# Patient Record
Sex: Female | Born: 1950 | Race: White | Hispanic: No | Marital: Married | Smoking: Former smoker
Health system: Southern US, Community
[De-identification: ages and names within clinical notes are randomized; demographics above are authoritative.]

## PROBLEM LIST (undated history)

## (undated) DIAGNOSIS — K620 Anal polyp: Secondary | ICD-10-CM

## (undated) HISTORY — DX: Anal polyp: K62.0

---

## 1998-05-16 ENCOUNTER — Other Ambulatory Visit: Admission: RE | Admit: 1998-05-16 | Discharge: 1998-05-16 | Payer: Self-pay | Admitting: Gynecology

## 1999-06-19 ENCOUNTER — Other Ambulatory Visit: Admission: RE | Admit: 1999-06-19 | Discharge: 1999-06-19 | Payer: Self-pay | Admitting: Gynecology

## 2000-02-11 ENCOUNTER — Encounter: Payer: Self-pay | Admitting: Gynecology

## 2000-02-11 ENCOUNTER — Encounter: Admission: RE | Admit: 2000-02-11 | Discharge: 2000-02-11 | Payer: Self-pay | Admitting: Gynecology

## 2000-06-24 ENCOUNTER — Other Ambulatory Visit: Admission: RE | Admit: 2000-06-24 | Discharge: 2000-06-24 | Payer: Self-pay | Admitting: Gynecology

## 2001-02-11 ENCOUNTER — Encounter: Admission: RE | Admit: 2001-02-11 | Discharge: 2001-02-11 | Payer: Self-pay | Admitting: Gynecology

## 2001-02-11 ENCOUNTER — Encounter: Payer: Self-pay | Admitting: Gynecology

## 2001-07-02 ENCOUNTER — Other Ambulatory Visit: Admission: RE | Admit: 2001-07-02 | Discharge: 2001-07-02 | Payer: Self-pay | Admitting: Gynecology

## 2002-02-15 ENCOUNTER — Encounter: Admission: RE | Admit: 2002-02-15 | Discharge: 2002-02-15 | Payer: Self-pay | Admitting: Gynecology

## 2002-02-15 ENCOUNTER — Encounter: Payer: Self-pay | Admitting: Gynecology

## 2002-03-02 ENCOUNTER — Encounter: Payer: Self-pay | Admitting: Family Medicine

## 2002-03-02 ENCOUNTER — Ambulatory Visit (HOSPITAL_COMMUNITY): Admission: RE | Admit: 2002-03-02 | Discharge: 2002-03-02 | Payer: Self-pay | Admitting: Family Medicine

## 2002-07-05 ENCOUNTER — Other Ambulatory Visit: Admission: RE | Admit: 2002-07-05 | Discharge: 2002-07-05 | Payer: Self-pay | Admitting: Gynecology

## 2003-03-23 ENCOUNTER — Encounter: Admission: RE | Admit: 2003-03-23 | Discharge: 2003-03-23 | Payer: Self-pay | Admitting: Gynecology

## 2003-03-23 ENCOUNTER — Encounter: Payer: Self-pay | Admitting: Gynecology

## 2003-08-08 ENCOUNTER — Other Ambulatory Visit: Admission: RE | Admit: 2003-08-08 | Discharge: 2003-08-08 | Payer: Self-pay | Admitting: Gynecology

## 2004-06-19 ENCOUNTER — Encounter: Admission: RE | Admit: 2004-06-19 | Discharge: 2004-06-19 | Payer: Self-pay | Admitting: Gynecology

## 2004-10-01 ENCOUNTER — Other Ambulatory Visit: Admission: RE | Admit: 2004-10-01 | Discharge: 2004-10-01 | Payer: Self-pay | Admitting: Gynecology

## 2005-07-02 ENCOUNTER — Encounter: Admission: RE | Admit: 2005-07-02 | Discharge: 2005-07-02 | Payer: Self-pay | Admitting: Gynecology

## 2005-09-19 ENCOUNTER — Encounter: Admission: RE | Admit: 2005-09-19 | Discharge: 2005-09-19 | Payer: Self-pay | Admitting: Family Medicine

## 2005-10-17 ENCOUNTER — Other Ambulatory Visit: Admission: RE | Admit: 2005-10-17 | Discharge: 2005-10-17 | Payer: Self-pay | Admitting: Gynecology

## 2005-11-19 ENCOUNTER — Encounter: Admission: RE | Admit: 2005-11-19 | Discharge: 2005-11-19 | Payer: Self-pay | Admitting: Family Medicine

## 2005-11-19 IMAGING — CR DG SHOULDER 2+V*L*
3 series · 3 of 3 positions shown · non-contrast
Comparison: None.

CLINICAL DATA: Shoulder pain following injury six weeks ago. 
 LEFT SHOULDER ? VIEWS THREE:

[view not recorded (1 of 3)]
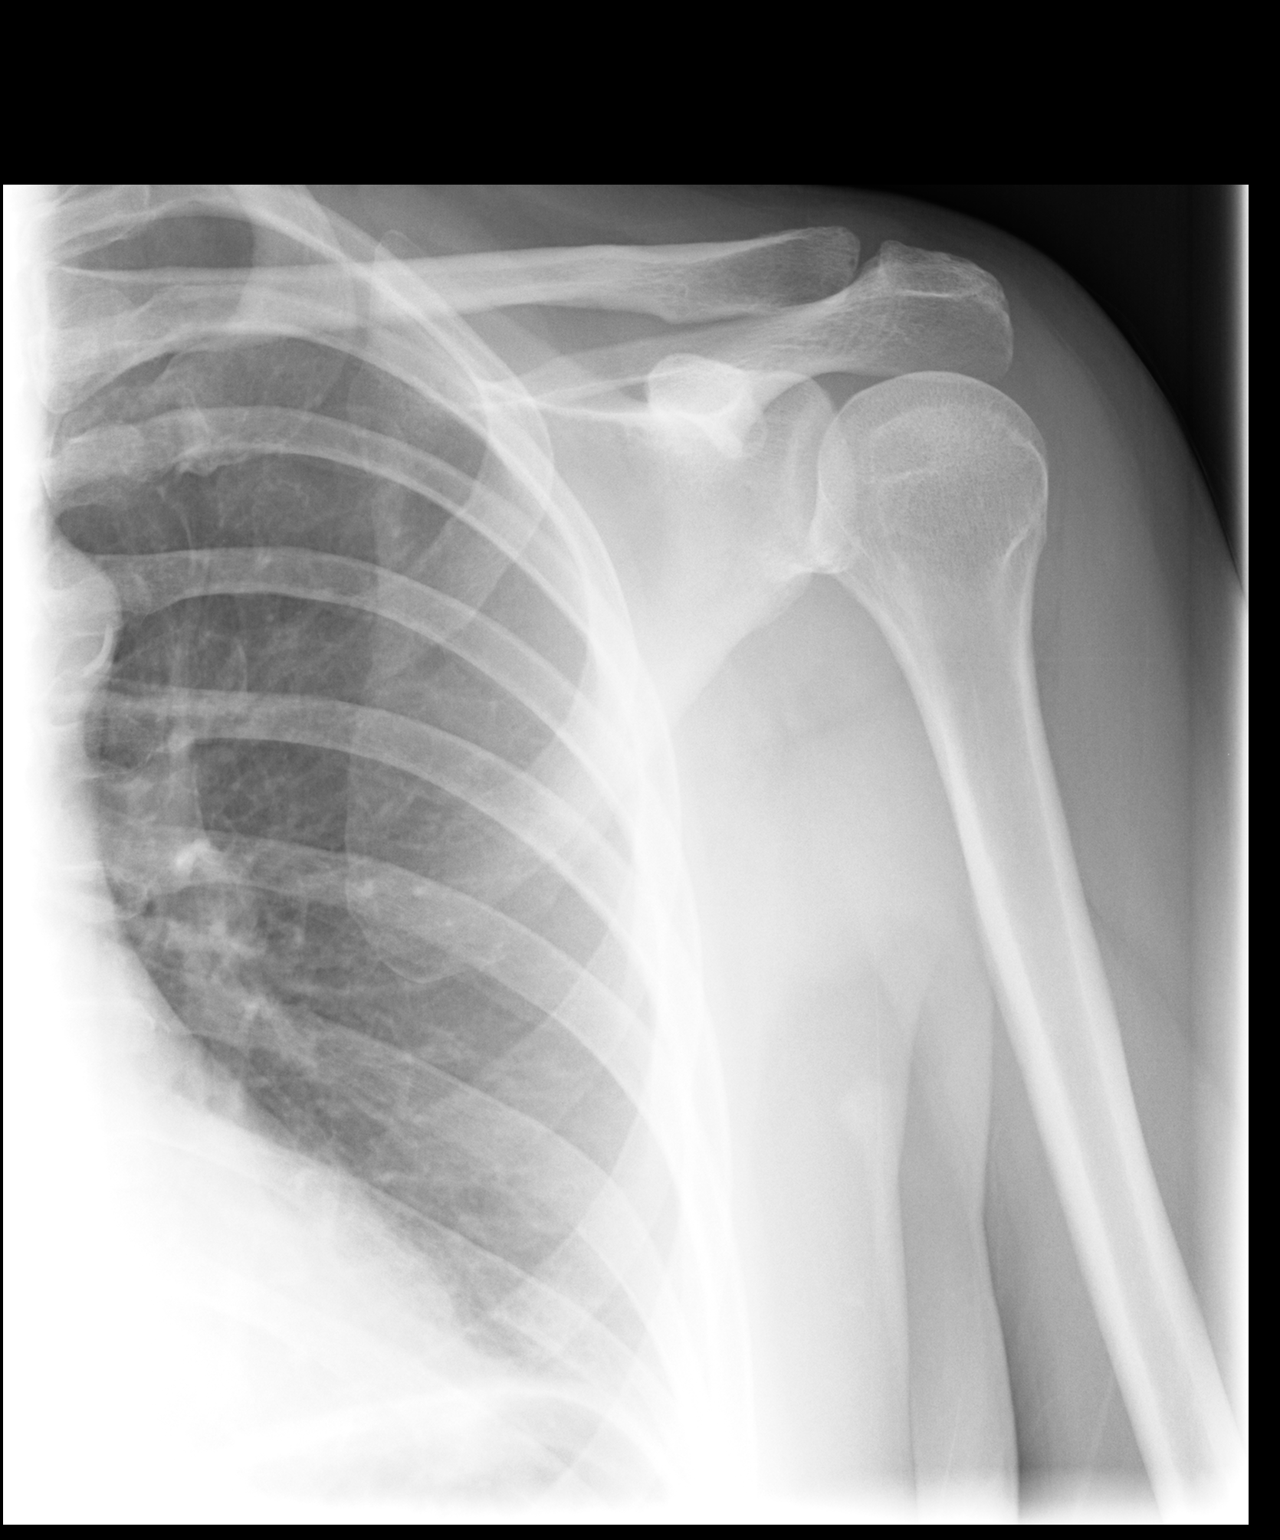

[view not recorded (2 of 3)]
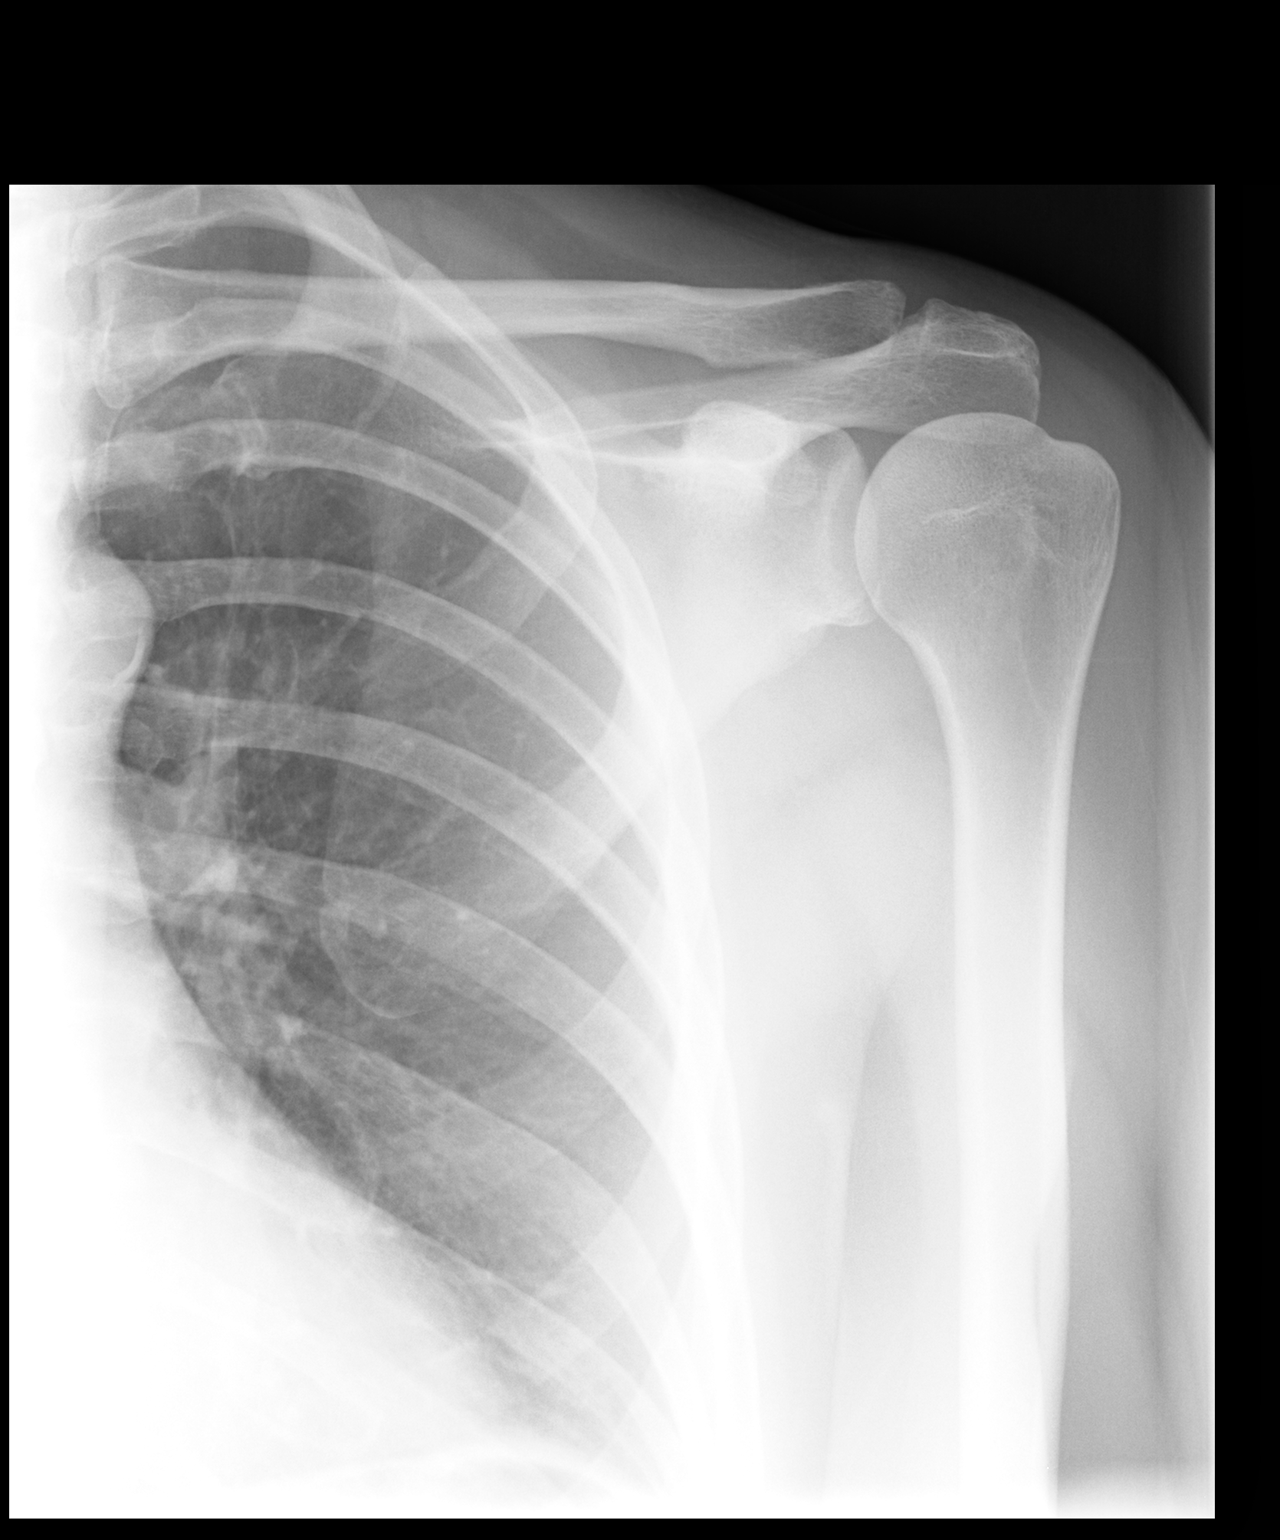

[view not recorded (3 of 3)]
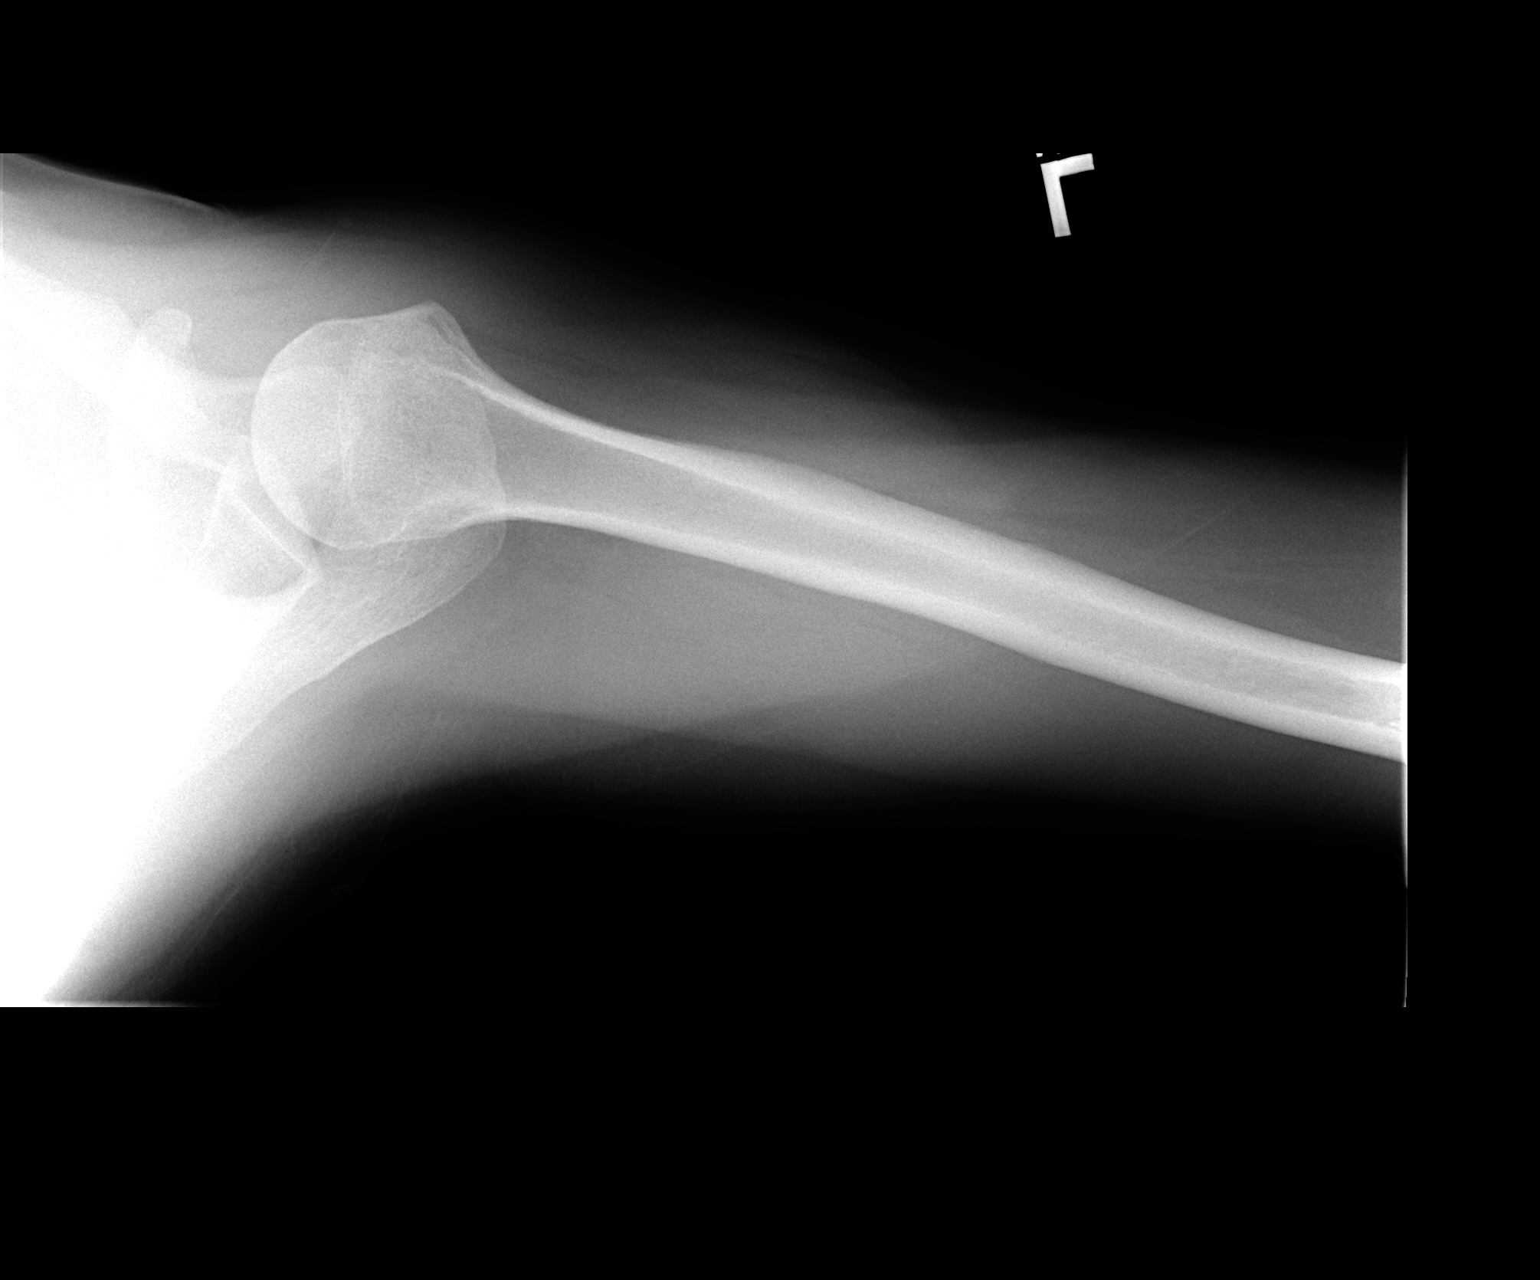

[3 of 3 positions shown; findings below may reference images not displayed]

FINDINGS: There is no evidence of fracture or dislocation.  There is no evidence of arthropathy or other focal bone abnormality.  Soft tissues are unremarkable.
IMPRESSION: Negative.

## 2006-12-05 ENCOUNTER — Ambulatory Visit (HOSPITAL_COMMUNITY): Admission: RE | Admit: 2006-12-05 | Discharge: 2006-12-05 | Payer: Self-pay | Admitting: Gynecology

## 2006-12-22 ENCOUNTER — Other Ambulatory Visit: Admission: RE | Admit: 2006-12-22 | Discharge: 2006-12-22 | Payer: Self-pay | Admitting: Gynecology

## 2007-04-23 ENCOUNTER — Encounter: Admission: RE | Admit: 2007-04-23 | Discharge: 2007-04-23 | Payer: Self-pay | Admitting: Surgery

## 2007-06-04 ENCOUNTER — Ambulatory Visit (HOSPITAL_COMMUNITY): Admission: RE | Admit: 2007-06-04 | Discharge: 2007-06-04 | Payer: Self-pay | Admitting: Surgery

## 2007-06-04 ENCOUNTER — Encounter (INDEPENDENT_AMBULATORY_CARE_PROVIDER_SITE_OTHER): Payer: Self-pay | Admitting: Surgery

## 2008-01-29 ENCOUNTER — Ambulatory Visit (HOSPITAL_COMMUNITY): Admission: RE | Admit: 2008-01-29 | Discharge: 2008-01-29 | Payer: Self-pay | Admitting: Gynecology

## 2009-03-08 ENCOUNTER — Ambulatory Visit (HOSPITAL_COMMUNITY): Admission: RE | Admit: 2009-03-08 | Discharge: 2009-03-08 | Payer: Self-pay | Admitting: Gynecology

## 2009-10-30 ENCOUNTER — Encounter: Admission: RE | Admit: 2009-10-30 | Discharge: 2009-10-30 | Payer: Self-pay | Admitting: Surgery

## 2009-11-23 ENCOUNTER — Ambulatory Visit (HOSPITAL_COMMUNITY): Admission: RE | Admit: 2009-11-23 | Discharge: 2009-11-23 | Payer: Self-pay | Admitting: Surgery

## 2010-06-05 ENCOUNTER — Ambulatory Visit (HOSPITAL_COMMUNITY): Admission: RE | Admit: 2010-06-05 | Discharge: 2010-06-05 | Payer: Self-pay | Admitting: Gynecology

## 2011-03-12 LAB — CBC
Hemoglobin: 14.4 g/dL (ref 12.0–15.0)
MCV: 95.7 fL (ref 78.0–100.0)
Platelets: 242 10*3/uL (ref 150–400)
RBC: 4.47 MIL/uL (ref 3.87–5.11)
RDW: 12.6 % (ref 11.5–15.5)
WBC: 4.8 10*3/uL (ref 4.0–10.5)

## 2011-03-12 LAB — COMPREHENSIVE METABOLIC PANEL
AST: 30 U/L (ref 0–37)
Albumin: 4 g/dL (ref 3.5–5.2)
CO2: 31 mEq/L (ref 19–32)
Chloride: 104 mEq/L (ref 96–112)
Potassium: 5 mEq/L (ref 3.5–5.1)

## 2011-03-12 LAB — DIFFERENTIAL
Basophils Absolute: 0 10*3/uL (ref 0.0–0.1)
Eosinophils Absolute: 0.2 10*3/uL (ref 0.0–0.7)
Lymphs Abs: 1.3 10*3/uL (ref 0.7–4.0)
Monocytes Relative: 9 % (ref 3–12)
Neutro Abs: 2.9 10*3/uL (ref 1.7–7.7)

## 2011-04-23 NOTE — Op Note (Signed)
[name], [name]           ACCOUNT NO.:  [id number]   MEDICAL RECORD NO.:  [id number]          PATIENT TYPE:  AMB   LOCATION:  DAY                          FACILITY:  [facility]   PHYSICIAN:  Sandria Bales. Ezzard Standing, M.D.  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   POSTOPERATIVE DIAGNOSIS:  Approximately 1.3 centimeter rectal carcinoid  (uT1N0),  approximately 10 centimeters from anal verge, on right lateral  rectal wall.   PROCEDURE:  1. Flexible sigmoidoscopy.  2. Rectal ultrasound.  3. Transanal excision of rectal carcinoid tumor.   SURGEON:  Dr. Ezzard Standing.   FIRST ASSISTANT:  None.   ANESTHESIA:  General.   ESTIMATED BLOOD LOSS:  50 mL.   DRAINS LEFT IN:  None.   INDICATION FOR PROCEDURE:  [name] is a 60 year old white female  who is a patient of Dr. Lupe Carney who underwent a routine  colonoscopy by Dr. Ritta Slot on the 24th of March, 2008.  He found an  approximately 2 cm submucosal lesion 9 cm from the anal verge and biopsy  of this tumor showed a carcinoid tumor.  She now comes for transanal  excision of this tumor.  I did a CT scan on her and this was negative  for any evidence of any tumor growth through the wall or external  abnormal masses from the rectum.   The indication and potential complications of the procedure were  explained to the patient.  Potential complications include bleeding,  infection, incomplete excision of the tumor, possible local recurrence  of the tumor or even the inability to resect the tumor, that she may  need a more formal colonic resection but we will not do that at this  time.   OPERATIVE NOTE:  Patient placed in lithotomy position.  A timeout was  held identifying the patient, the procedure.   First I did a flexible sigmoidoscopy.  I identified  this tumor at about  10 cm from the anal verge.  This seemed to emanate from the right  lateral rectal wall, was probably about 1.5 cm on examination through an  endoscope.   I then used the 10 MHz rectal ultrasound and did an ultrasound of this  mass.  The ultrasound shows this being a right lateral rectal wall mass.  Themass  has a thickness of 5.4 mm, a width of 15.7 mm, it appears the  mass goes through the submucosal, but not into the muscularis mucosa.  This would be a uT1N0 tumor.   I set up the [facility] retractor to expose the rectum.  I was able  to identify this tumor along the right lateral rectal wall.  It measures  about 10 cm from the anal verge.  It was quite a reach to get to this,  but I was able to put holding sutures in 4 corners around the tumor.  I  then excised the tumor and I thought I got entirety around  it with a rim  of normal mucosa.  I marked a long suture posterior to the tumor and a  short suture anterior as markings or orientations.   I then closed the mucosal defect with a running #2-0 chromic suture.  I  put at least two more chromics in the bleeding sites.  After the  excision had been completed, I irrigated out the rectum, I saw no  bleeding, I was able to pass an anoscope beyond, I thought I had not  compromised the lumen of the bowel.   I then placed a Gelfoam gauze covered in Nupracainal ointment up the  rectum.   Sponge and needle count were correct at the end of the case.  Patient  tolerated the procedure well, was transported to the recovery room with  plans to be discharged home today.      Sandria Bales. Ezzard Standing, M.D.  Electronically Signed     DHN/MEDQ  D:  06/04/2007  T:  06/04/2007  Job:  [id number]   cc:   L. Lupe Carney, M.D.  Fax: [phone]   Gretta Cool, M.D.  Fax: [phone]   Griffith Citron, M.D.  Fax: [phone]

## 2011-06-24 ENCOUNTER — Other Ambulatory Visit (HOSPITAL_COMMUNITY): Payer: Self-pay | Admitting: Gynecology

## 2011-06-24 DIAGNOSIS — Z1231 Encounter for screening mammogram for malignant neoplasm of breast: Secondary | ICD-10-CM

## 2011-07-01 ENCOUNTER — Other Ambulatory Visit: Payer: Self-pay | Admitting: Gynecology

## 2011-07-02 ENCOUNTER — Ambulatory Visit (HOSPITAL_COMMUNITY)
Admission: RE | Admit: 2011-07-02 | Discharge: 2011-07-02 | Disposition: A | Discharge status: Home / Self Care | Payer: BC Managed Care – PPO | Source: Ambulatory Visit | Attending: Gynecology | Admitting: Gynecology

## 2011-07-02 DIAGNOSIS — Z1231 Encounter for screening mammogram for malignant neoplasm of breast: Secondary | ICD-10-CM | POA: Insufficient documentation

## 2011-09-25 LAB — COMPREHENSIVE METABOLIC PANEL
CO2: 30
Calcium: 9.8
Chloride: 106
Creatinine, Ser: 0.87
GFR calc Af Amer: >60
GFR calc non Af Amer: >60
Potassium: 4.4
Total Bilirubin: 1.4 — ABNORMAL HIGH
Total Protein: 6.5

## 2011-09-25 LAB — CBC
HCT: 44.3
MCV: 89.8
RDW: 12.6
WBC: 4.9

## 2011-09-25 LAB — DIFFERENTIAL
Basophils Absolute: 0
Lymphocytes Relative: 26
Monocytes Absolute: 0.6
Monocytes Relative: 13 — ABNORMAL HIGH
Neutro Abs: 2.8
Neutrophils Relative %: 57

## 2012-06-19 ENCOUNTER — Other Ambulatory Visit (HOSPITAL_COMMUNITY): Payer: Self-pay | Admitting: Gynecology

## 2012-06-19 DIAGNOSIS — Z1231 Encounter for screening mammogram for malignant neoplasm of breast: Secondary | ICD-10-CM

## 2012-07-07 ENCOUNTER — Ambulatory Visit (HOSPITAL_COMMUNITY)
Admission: RE | Admit: 2012-07-07 | Discharge: 2012-07-07 | Disposition: A | Discharge status: Home / Self Care | Payer: BC Managed Care – PPO | Source: Ambulatory Visit | Attending: Gynecology | Admitting: Gynecology

## 2012-07-07 DIAGNOSIS — Z1231 Encounter for screening mammogram for malignant neoplasm of breast: Secondary | ICD-10-CM | POA: Insufficient documentation

## 2012-07-10 ENCOUNTER — Other Ambulatory Visit: Payer: Self-pay | Admitting: Gynecology

## 2013-07-02 ENCOUNTER — Other Ambulatory Visit (HOSPITAL_COMMUNITY): Payer: Self-pay | Admitting: Gynecology

## 2013-07-02 DIAGNOSIS — Z1231 Encounter for screening mammogram for malignant neoplasm of breast: Secondary | ICD-10-CM

## 2013-07-09 ENCOUNTER — Ambulatory Visit (HOSPITAL_COMMUNITY)
Admission: RE | Admit: 2013-07-09 | Discharge: 2013-07-09 | Disposition: A | Discharge status: Home / Self Care | Payer: BC Managed Care – PPO | Source: Ambulatory Visit | Attending: Gynecology | Admitting: Gynecology

## 2013-07-09 DIAGNOSIS — Z1231 Encounter for screening mammogram for malignant neoplasm of breast: Secondary | ICD-10-CM

## 2014-06-21 ENCOUNTER — Other Ambulatory Visit (HOSPITAL_COMMUNITY): Payer: Self-pay | Admitting: Gynecology

## 2014-06-21 DIAGNOSIS — Z1231 Encounter for screening mammogram for malignant neoplasm of breast: Secondary | ICD-10-CM

## 2014-07-12 ENCOUNTER — Ambulatory Visit (HOSPITAL_COMMUNITY)
Admission: RE | Admit: 2014-07-12 | Discharge: 2014-07-12 | Disposition: A | Discharge status: Home / Self Care | Payer: Medicare Other | Source: Ambulatory Visit | Attending: Gynecology | Admitting: Gynecology

## 2014-07-12 DIAGNOSIS — Z1231 Encounter for screening mammogram for malignant neoplasm of breast: Secondary | ICD-10-CM | POA: Diagnosis present

## 2015-06-30 ENCOUNTER — Other Ambulatory Visit (HOSPITAL_COMMUNITY): Payer: Self-pay | Admitting: Gynecology

## 2015-06-30 DIAGNOSIS — Z1231 Encounter for screening mammogram for malignant neoplasm of breast: Secondary | ICD-10-CM

## 2015-07-14 ENCOUNTER — Ambulatory Visit (HOSPITAL_COMMUNITY): Payer: BC Managed Care – PPO

## 2015-07-19 ENCOUNTER — Ambulatory Visit (HOSPITAL_COMMUNITY)
Admission: RE | Admit: 2015-07-19 | Discharge: 2015-07-19 | Disposition: A | Discharge status: Home / Self Care | Payer: Medicare Other | Source: Ambulatory Visit | Attending: Gynecology | Admitting: Gynecology

## 2015-07-19 DIAGNOSIS — Z1231 Encounter for screening mammogram for malignant neoplasm of breast: Secondary | ICD-10-CM | POA: Insufficient documentation

## 2017-05-27 ENCOUNTER — Ambulatory Visit (INDEPENDENT_AMBULATORY_CARE_PROVIDER_SITE_OTHER): Payer: Medicare Other

## 2017-05-27 ENCOUNTER — Other Ambulatory Visit (INDEPENDENT_AMBULATORY_CARE_PROVIDER_SITE_OTHER): Payer: Self-pay

## 2017-05-27 ENCOUNTER — Encounter (INDEPENDENT_AMBULATORY_CARE_PROVIDER_SITE_OTHER): Payer: Self-pay

## 2017-05-27 ENCOUNTER — Encounter (INDEPENDENT_AMBULATORY_CARE_PROVIDER_SITE_OTHER): Payer: Self-pay | Admitting: Orthopaedic Surgery

## 2017-05-27 ENCOUNTER — Ambulatory Visit (INDEPENDENT_AMBULATORY_CARE_PROVIDER_SITE_OTHER): Payer: Medicare Other | Admitting: Orthopaedic Surgery

## 2017-05-27 VITALS — BP 147/96 | HR 77 | Resp 12 | Ht 66.0 in | Wt 175.0 lb

## 2017-05-27 DIAGNOSIS — M791 Myalgia, unspecified site: Secondary | ICD-10-CM

## 2017-05-27 DIAGNOSIS — M5440 Lumbago with sciatica, unspecified side: Secondary | ICD-10-CM

## 2017-05-27 DIAGNOSIS — M25551 Pain in right hip: Secondary | ICD-10-CM

## 2017-05-27 MED ORDER — METHYLPREDNISOLONE ACETATE 40 MG/ML IJ SUSP
40.0000 mg | INTRAMUSCULAR | Status: AC | PRN
  Administered 2017-05-27: 40 mg via INTRAMUSCULAR

## 2017-05-27 MED ORDER — LIDOCAINE HCL 1 % IJ SOLN
2.0000 mL | INTRAMUSCULAR | Status: AC | PRN
  Administered 2017-05-27: 2 mL

## 2017-05-27 MED ORDER — BUPIVACAINE HCL 0.5 % IJ SOLN
2.0000 mL | INTRAMUSCULAR | Status: AC | PRN
  Administered 2017-05-27: 2 mL

## 2017-05-27 NOTE — Progress Notes (Signed)
Office Visit Note   Patient: [name]           Date of Birth: [birth year]           MRN: [id number] Visit Date: 05/27/2017              Requested by: No referring provider defined for this encounter. PCP: Alroy Dust, L.Marlou Sa, MD   Assessment & Plan: Visit Diagnoses:  1. Acute right-sided low back pain with sciatica, sciatica laterality unspecified   2. Pain in right hip     Plan:  #1: Injection of right parasacral area administered by Dr. Durward Fortes #2: Sent to physical therapy  Follow-Up Instructions: Return if symptoms worsen or fail to improve.   Orders:  Orders Placed This Encounter  Procedures  . XR Lumbar Spine 2-3 Views  . XR HIP UNILAT W OR W/O PELVIS 2-3 VIEWS RIGHT   No orders of the defined types were placed in this encounter.     Procedures: Trigger Point Inj Date/Time: 05/27/2017 11:01 AM Performed by: Biagio Borg D Authorized by: Biagio Borg D   Consent Given by:  Patient Site marked: the procedure site was marked   Timeout: prior to procedure the correct patient, procedure, and site was verified   Total # of Trigger Points:  1 Location: back   Needle Size:  25 G Medications #1:  2 mL lidocaine 1 %; 2 mL bupivacaine 0.5 %; 40 mg methylPREDNISolone acetate 40 MG/ML Patient tolerance:  Patient tolerated the procedure well with no immediate complications     Clinical Data: No additional findings.   Subjective: Chief Complaint  Patient presents with  . Lower Back - Pain    [name] is a 21 y o that presents with LBP 2 weeks when she was lifting her grand children. She relates 7 yrs ago LBP but nothing since. She takes aleve for relief. Trouble sleeping, pain radiates sometimes to R sided hip pain.    [name] is a pleasant 66 y/o o that presents with LBP for the past 2 weeks which started when she was lifting her grand children. She relates 7 yrs ago LBP but nothing since until now. She states that she has trouble  sleeping as well as some pain radiatingf sometimes to the right sided hip. She takes aleve for relief.     Review of Systems  Constitutional: Negative.   HENT: Negative.   Respiratory: Negative.   Cardiovascular: Negative.   Gastrointestinal: Negative.   Genitourinary: Negative.   Skin: Negative.   Neurological: Negative.   Hematological: Negative.   Psychiatric/Behavioral: Negative.      Objective: Vital Signs: BP (!) 147/96   Pulse 77   Resp 12   Ht 5\' 6"  (1.676 m)   Wt 175 lb (79.4 kg)   BMI 28.25 kg/m   Physical Exam  Constitutional: She is oriented to person, place, and time. She appears well-developed and well-nourished.  HENT:  Head: Normocephalic and atraumatic.  Eyes: EOM are normal. Pupils are equal, round, and reactive to light.  Pulmonary/Chest: Effort normal.  Neurological: She is alert and oriented to person, place, and time.  Skin: Skin is warm and dry.  Psychiatric: She has a normal mood and affect. Her behavior is normal. Judgment and thought content normal.    Ortho Exam  She has tenderness over the right parasacral area to palpation. This is localized. Deep tendon reflexes are physiologic bilateral symmetric in lower extremities. Negative straight leg raising. Good strength. Neurovascular  intact distally.  Specialty Comments:  No specialty comments available.  Imaging: Xr Hip Unilat W Or W/o Pelvis 2-3 Views Right  Result Date: 05/27/2017 Two-view AP pelvis and right hip reveals maintenance of joint space of the right hip. Little bit of spurring at the lateral aspect of the superior acetabulum. She does have some degenerative changes symphysis pubis. The SI joint certainly on the right is quite degenerative in comparison to the left. Multiple vasculitic ligature clips are noted in the pelvis and distal lumbar spine.  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  Some degenerative changes in the facets noted. Lateral x-rays today reveal some degenerative changes L2-3. Anterior spurring at L2 and L3. Also some at L1.    PMFS History: There are no active problems to display for this patient.  Past Medical History:  Diagnosis Date  . Polyp of anal verge     Family History  Problem Relation Age of Onset  . Sjogren's syndrome Mother     History reviewed. No pertinent surgical history. Social History   Occupational History  . Not on file.   Social History Main Topics  . Smoking status: Former Research scientist (life sciences)  . Smokeless tobacco: Never Used  . Alcohol use Not on file  . Drug use: No  . Sexual activity: Not on file

## 2017-05-29 ENCOUNTER — Encounter: Payer: Self-pay | Admitting: Physical Therapy

## 2017-05-29 ENCOUNTER — Ambulatory Visit: Payer: Medicare Other | Attending: Family Medicine | Admitting: Physical Therapy

## 2017-05-29 DIAGNOSIS — M545 Low back pain, unspecified: Secondary | ICD-10-CM

## 2017-05-29 DIAGNOSIS — M62830 Muscle spasm of back: Secondary | ICD-10-CM | POA: Diagnosis present

## 2017-05-30 NOTE — Therapy (Signed)
[place], [place], [address] Phone: [phone]   Fax:  [phone]  Physical Therapy Treatment  Patient Details  Name: [name] MRN: [id number] Date of Birth: Dec 13, 1950 Referring Provider: Dr Joni Fears   Encounter Date: 05/29/2017      PT End of Session - 05/30/17 1315    Visit Number 1   Number of Visits 8   Date for PT Re-Evaluation 06/27/17   Authorization Type Medicare   PT Start Time 1415   PT Stop Time 1500   PT Time Calculation (min) 45 min   Activity Tolerance Patient tolerated treatment well   Behavior During Therapy [facility] for tasks assessed/performed      Past Medical History:  Diagnosis Date  . Polyp of anal verge     History reviewed. No pertinent surgical history.  There were no vitals filed for this visit.      Subjective Assessment - 05/29/17 1423    Patient Stated Goals To have less pain and a plan for dealing with her back in the future.    Currently in Pain? Yes   Pain Score 6    Pain Location Back   Pain Orientation Right   Pain Descriptors / Indicators Aching   Pain Type Acute pain   Pain Onset 1 to 4 weeks ago   Pain Frequency Intermittent   Aggravating Factors  Bending and twisting    Pain Relieving Factors Rest, naprosin    Effect of Pain on Daily Activities Difficulty lifting; Has not been back to the gym             [facility] PT Assessment - 05/30/17 0001      Assessment   Medical Diagnosis Right sided low back pain    Referring Provider Dr Joni Fears    Onset Date/Surgical Date 05/22/17   Hand Dominance Right   Next MD Visit None Scheduled    Prior Therapy none     Precautions   Precautions None     Restrictions   Weight Bearing Restrictions No     Balance Screen   Has the patient fallen in the past 6 months No   Has the patient had a decrease in activity level because of a fear of falling?  No   Is the patient reluctant to  leave their home because of a fear of falling?  No     Home Environment   Additional Comments Steps at the house. Can feel it in her knees     Prior Function   Level of Independence Independent   Vocation Retired   Leisure gym, walking      Cognition   Overall Cognitive Status Within Functional Limits for tasks assessed   Attention Focused   Focused Attention Appears intact   Memory Appears intact   Awareness Appears intact   Problem Solving Appears intact     Observation/Other Assessments   Observations left leg has chrnic edema from a lymph node surgery    Focus on Therapeutic Outcomes (FOTO)  46% limitation      Sensation   Light Touch Appears Intact   Additional Comments Denies parathesias      Coordination   Gross Motor Movements are Fluid and Coordinated Yes   Fine Motor Movements are Fluid and Coordinated Yes     Posture/Postural Control   Posture Comments rounded shoulders      AROM   AROM Assessment Site Lumbar   Lumbar Flexion  25% limited with some reporduction of pain    Lumbar - Right Side Bend 25% limited with s reproduction of pain    Lumbar - Right Rotation 25% limited with some reporduction of pain      PROM   Overall PROM Comments normal PROM of patients hisp but minor increase inpain with right hip flexion      Strength   Overall Strength Comments Left hip / knee 5/5 despite edema    Strength Assessment Site Hip;Knee   Right/Left Hip Right   Right Hip Flexion 4+/5   Right Hip ABduction 4+/5   Right/Left Knee Right   Right Knee Flexion 5/5   Right Knee Extension 5/5     Flexibility   Soft Tissue Assessment /Muscle Length --  90/90 hamstring 20 degrees right 17 degrees left      Palpation   Palpation comment spasming from L4-L5 paraspinals along PSIS and into glut med area      Special Tests    Special Tests --  SLR (+) 30 degrees on the right      Ambulation/Gait   Gait Comments normal gait                      [facility]  Adult PT Treatment/Exercise - 05/30/17 0001      Lumbar Exercises: Stretches   Passive Hamstring Stretch Limitations 90/90 hamstring stretch x30seconds bilateral    Prone Mid Back Stretch Limitations praye and lateral prayer stretch to the left to open up facet joints    Piriformis Stretch Limitations 3x20 sec      Lumbar Exercises: Standing   Other Standing Lumbar Exercises tennis ball trigger point release      Lumbar Exercises: Supine   AB Set Limitations 2x10                 PT Education - 05/30/17 1312    Education provided Yes   Education Details HEP for strethcing, symptom management    Person(s) Educated Patient   Methods Explanation;Demonstration;Tactile cues;Verbal cues   Comprehension Verbalized understanding;Returned demonstration;Verbal cues required;Tactile cues required          PT Short Term Goals - 05/30/17 1320      PT SHORT TERM GOAL #1   Title LTG = STG           PT Long Term Goals - 05/30/17 1320      PT LONG TERM GOAL #1   Title Patient will have an HEP that promotes core stability    Time 4   Period Weeks   Status New     PT LONG TERM GOAL #2   Title Patient will demsotrate 5/5 bilateral LE strength in order to lift her grandchildren    Time 4   Period Weeks   Status New     PT LONG TERM GOAL #3   Title Patient will demsotrate proper lifting tehcnique    Time 4   Period Weeks   Status New     PT LONG TERM GOAL #4   Title Patient will demsotrate a 26% limitation on FOTO    Time 4   Period Weeks   Status New               Plan - 05/30/17 1317    Clinical Impression Statement Patient is a 66 year old female with right sided lower back pain. The patient has more pain when rotates to the right and when she side  bends to the right. She appears to have facet joint dysfucntion on that side. She was given stretches this visit to help improve her spainal mobility. She will come back for 1 more visit to work on core  strengthening exercises. She was seen for a low complexity evaluation. 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    Clinical Presentation Evolving   Clinical Decision Making Moderate   Rehab Potential Good   PT Frequency 2x / week   PT Duration 8 weeks   PT Treatment/Interventions ADLs/Self Care Home Management;Iontophoresis 4mg /ml Dexamethasone;Electrical Stimulation;Cryotherapy;Moist Heat;Traction;Dry needling;Taping;Splinting;Patient/family education;Therapeutic exercise;Therapeutic activities;Functional mobility training   PT Next Visit Plan review core exercises clam shells, bridges, supine marches, SLR; shoulder extension with abdonminal bracing; review lifting technique    PT Home Exercise Plan see instructions    Consulted and Agree with Plan of Care Patient      Patient will benefit from skilled therapeutic intervention in order to improve the following deficits and impairments:     Visit Diagnosis: Acute right-sided low back pain without sciatica - Plan: PT plan of care cert/re-cert  Muscle spasm of back - Plan: PT plan of care cert/re-cert       G-Codes - Jun 26, 2017 1323    Functional Assessment Tool Used (Outpatient Only) FOTO, Clinical decision making    Functional Limitation Carrying, moving and handling objects   Carrying, Moving and Handling Objects Current Status (K7425) At least 40 percent but less than 60 percent impaired, limited or restricted   Carrying, Moving and Handling Objects Goal Status (Z5638) At least 20 percent but less than 40 percent impaired, limited or restricted      Problem List There are no active problems to display for this patient.   Carney Living 06-26-17, 1:25 PM  [facility] [address] [place], [place], [address] Phone: [phone]   Fax:  [phone]  Name: [name] MRN: [id number] Date of Birth: 1951/03/05

## 2017-06-06 ENCOUNTER — Encounter: Payer: Self-pay | Admitting: Physical Therapy

## 2017-06-06 ENCOUNTER — Ambulatory Visit: Payer: Medicare Other | Admitting: Physical Therapy

## 2017-06-06 DIAGNOSIS — M545 Low back pain, unspecified: Secondary | ICD-10-CM

## 2017-06-06 DIAGNOSIS — M62830 Muscle spasm of back: Secondary | ICD-10-CM

## 2017-06-06 NOTE — Therapy (Signed)
[place], [place], [address] Phone: [phone]   Fax:  [phone]  Physical Therapy Treatment/ Discharge  Patient Details  Name: [name] MRN: [id number] Date of Birth: June 12, 1951 Referring Provider: Dr Joni Fears   Encounter Date: 06/06/2017      PT End of Session - 06/06/17 0803    Visit Number 2   Number of Visits 8   Date for PT Re-Evaluation 06/27/17   Authorization Type Medicare   PT Start Time 8841   PT Stop Time 0843   PT Time Calculation (min) 45 min      Past Medical History:  Diagnosis Date  . Polyp of anal verge     History reviewed. No pertinent surgical history.  There were no vitals filed for this visit.      Subjective Assessment - 06/06/17 0805    Subjective Patient feels like the stretches are working well. She is not having any pain today. She would benefit from education on exercises.    Limitations Standing   Patient Stated Goals To have less pain and a plan for dealing with her back in the future.    Currently in Pain? No/denies                                 PT Education - 06/06/17 0816    Education provided Yes   Education Details Reviewed strengthening and progression    Person(s) Educated Patient   Methods Explanation;Demonstration;Tactile cues;Verbal cues;Handout   Comprehension Verbalized understanding;Returned demonstration;Verbal cues required;Tactile cues required          PT Short Term Goals - 05/30/17 1320      PT SHORT TERM GOAL #1   Title LTG = STG           PT Long Term Goals - 06/06/17 6606      PT LONG TERM GOAL #1   Title Patient will have an HEP that promotes core stability    Baseline reviewed today. Patient feels confident with her exercises    Time 4   Period Weeks   Status On-going     PT LONG TERM GOAL #2   Title Patient will demsotrate 5/5 bilateral LE strength in order to lift  her grandchildren    Time 4   Period Weeks   Status On-going     PT LONG TERM GOAL #3   Title Patient will demsotrate proper lifting tehcnique    Time 4   Period Weeks   Status Achieved     PT LONG TERM GOAL #4   Title Patient will demsotrate a 26% limitation on FOTO    Baseline 33% but only did 2 visits    Time 4   Period Weeks   Status Achieved               Plan - 06/06/17 0817    Clinical Impression Statement Patient toleratedtreatment well. No signifcant increase in pain with exercises patient given complete program. She feels comfortable with her exercises D/C to HEP at this time.    Clinical Presentation Stable   Clinical Decision Making Low   Rehab Potential Good   PT Frequency 2x / week   PT Duration 8 weeks   PT Treatment/Interventions ADLs/Self Care Home Management;Iontophoresis '4mg'$ /ml Dexamethasone;Electrical Stimulation;Cryotherapy;Moist Heat;Traction;Dry needling;Taping;Splinting;Patient/family education;Therapeutic exercise;Therapeutic activities;Functional mobility training   PT Next Visit Plan review core exercises clam shells,  bridges, supine marches, SLR; shoulder extension with abdonminal bracing; review lifting technique    PT Home Exercise Plan see instructions       Patient will benefit from skilled therapeutic intervention in order to improve the following deficits and impairments:     Visit Diagnosis: Acute right-sided low back pain without sciatica  Muscle spasm of back       G-Codes - 06/25/2017 0840    Functional Assessment Tool Used (Outpatient Only) FOTO, Clinical decision making    Functional Limitation Carrying, moving and handling objects   Carrying, Moving and Handling Objects Current Status (T1959) At least 40 percent but less than 60 percent impaired, limited or restricted   Carrying, Moving and Handling Objects Goal Status (D4718) At least 20 percent but less than 40 percent impaired, limited or restricted   Carrying, Moving  and Handling Objects Discharge Status [phone]) At least 20 percent but less than 40 percent impaired, limited or restricted      Problem List There are no active problems to display for this patient.  PHYSICAL THERAPY DISCHARGE SUMMARY  Visits from Start of Care: 2  Current functional level related to goals / functional outcomes: No pain for the past few days    Remaining deficits: none   Education / Equipment: HEP  Plan: Patient agrees to discharge.  Patient goals were not met. Patient is being discharged due to meeting the stated rehab goals.  ?????      Carney Living PT DPT  06-25-17, 8:43 AM  [facility] [address] [place], [place], [address] Phone: [phone]   Fax:  [phone]  Name: SHATERA RENNERT MRN: [id number] Date of Birth: 1951-04-20

## 2019-02-16 ENCOUNTER — Ambulatory Visit (INDEPENDENT_AMBULATORY_CARE_PROVIDER_SITE_OTHER): Payer: Medicare Other | Admitting: Family Medicine

## 2019-02-16 ENCOUNTER — Ambulatory Visit (INDEPENDENT_AMBULATORY_CARE_PROVIDER_SITE_OTHER): Payer: Medicare Other

## 2019-02-16 ENCOUNTER — Encounter (INDEPENDENT_AMBULATORY_CARE_PROVIDER_SITE_OTHER): Payer: Self-pay | Admitting: Family Medicine

## 2019-02-16 DIAGNOSIS — G8929 Other chronic pain: Secondary | ICD-10-CM | POA: Diagnosis not present

## 2019-02-16 DIAGNOSIS — M25562 Pain in left knee: Secondary | ICD-10-CM | POA: Diagnosis not present

## 2019-02-16 DIAGNOSIS — M25561 Pain in right knee: Secondary | ICD-10-CM

## 2019-02-16 DIAGNOSIS — Z780 Asymptomatic menopausal state: Secondary | ICD-10-CM | POA: Insufficient documentation

## 2019-02-16 DIAGNOSIS — M1712 Unilateral primary osteoarthritis, left knee: Secondary | ICD-10-CM

## 2019-02-16 MED ORDER — METHYLPREDNISOLONE ACETATE 40 MG/ML IJ SUSP: 40.0000 mg | Freq: Once | INTRAMUSCULAR | Status: AC

## 2019-02-16 NOTE — Progress Notes (Signed)
  [name] - 68 y.o. female MRN [id number]  Date of birth: [birth year]    SUBJECTIVE:      Chief Complaint: left knee pain  HPI:  68 year old female with many months of sharp left anterior knee pain which has been worsening recently.  She denies any injury.  Her pain is localized to the anterior knee around the kneecap.  Her pain is worse with walking or going downstairs.  She usually gets improvement with naproxen.  Yesterday she went for a long walk and experienced increased pain later that evening and this morning.  She reports feeling of stiffness.  No significant swelling or erythema reported.  She denies any locking or giving out.  She denies any bruising.  No skin changes.  No distal numbness or tingling   ROS:     See HPI  PERTINENT  PMH / PSH FH / / SH:  Past Medical, Surgical, Social, and Family History Reviewed & Updated in the EMR.    OBJECTIVE: There were no vitals taken for this visit.  Physical Exam:  Vital signs are reviewed.  GEN: Alert and oriented, NAD Pulm: Breathing unlabored PSY: normal mood, congruent affect  MSK: Left knee: - Inspection: No bony abnormalities.  No effusion.  She does have chronic edema of the left leg from previous lymph node resection - Palpation: Mild tenderness over the medial joint line - ROM: full active ROM with flexion and extension in knee.  Patient reports feeling of crepitus and pain with active flexion and extension of the knee - Strength: 5/5 strength - Neuro/vasc: NV intact - Special Tests: - LIGAMENTS: negative Lachman's, no MCL or LCL laxity  -- MENISCUS: negative McMurray's  Right knee: No obvious abnormalities.  No effusion.  No soft tissue edema No tenderness Full range of motion N/V intact distally  Left hip: Normal passive range of motion with IR/ER without pain   ASSESSMENT & PLAN:  20.  68 year old female with anterior left knee pain-secondary to patellofemoral osteoarthritis. -Steroid injection  performed today - Provided with home strengthening exercises -Follow-up as needed   Procedure performed: knee intraarticular corticosteroid injection; palpation guided Consent obtained and verified. Time-out conducted. Noted no overlying erythema, induration, or other signs of local infection. The  left lateral patellofemoral joint space was palpated and marked. The overlying skin was prepped in a sterile fashion. Topical analgesic spray: Ethyl chloride. Joint: Left knee Needle: 25-gauge, 1.5 inch Completed without difficulty. Meds: Depo-Medrol 40 mg, lidocaine 4 cc

## 2019-02-16 NOTE — Progress Notes (Signed)
I saw and examined the patient with Dr. Okey Dupre and agree with assessment and plan as outlined.  Left knee DJD, mainly patellofemoral.  Will inject with cortisone.  Home SLR exercises.  Gel injections if pain persists.

## 2019-10-25 ENCOUNTER — Other Ambulatory Visit: Payer: Self-pay

## 2019-10-25 DIAGNOSIS — Z20822 Contact with and (suspected) exposure to covid-19: Secondary | ICD-10-CM

## 2019-10-27 LAB — NOVEL CORONAVIRUS, NAA: SARS-CoV-2, NAA: NOT DETECTED

## 2020-01-08 ENCOUNTER — Ambulatory Visit: Payer: BC Managed Care – PPO

## 2020-01-13 ENCOUNTER — Ambulatory Visit: Payer: BC Managed Care – PPO

## 2020-01-15 ENCOUNTER — Ambulatory Visit: Payer: Medicare PPO | Attending: Internal Medicine

## 2020-01-15 DIAGNOSIS — Z23 Encounter for immunization: Secondary | ICD-10-CM

## 2020-01-15 NOTE — Progress Notes (Signed)
   Covid-19 Vaccination Clinic  Name:  [name]    MRN: [id number] DOB: 1951-04-07  01/15/2020  [name] was observed post Covid-19 immunization for 15 minutes without incidence. She was provided with Vaccine Information Sheet and instruction to access the V-Safe system.   [name] was instructed to call 911 with any severe reactions post vaccine: Marland Kitchen Difficulty breathing  . Swelling of your face and throat  . A fast heartbeat  . A bad rash all over your body  . Dizziness and weakness    Immunizations Administered    Name Date Dose VIS Date Route   Pfizer COVID-19 Vaccine 01/15/2020  5:05 PM 0.3 mL 11/19/2019 Intramuscular   Manufacturer: [place]   Lot: [id number]   [place]: [id number]

## 2020-02-09 ENCOUNTER — Ambulatory Visit: Payer: Medicare PPO | Attending: Internal Medicine

## 2020-02-09 DIAGNOSIS — Z23 Encounter for immunization: Secondary | ICD-10-CM | POA: Insufficient documentation

## 2020-02-09 NOTE — Progress Notes (Signed)
   Covid-19 Vaccination Clinic  Name:  GENEVIVE FARES    MRN: [id number] DOB: July 23, 1951  02/09/2020  Ms. Lehane was observed post Covid-19 immunization for 15 minutes without incident. She was provided with Vaccine Information Sheet and instruction to access the V-Safe system.   Ms. Mcvaugh was instructed to call 911 with any severe reactions post vaccine: Marland Kitchen Difficulty breathing  . Swelling of face and throat  . A fast heartbeat  . A bad rash all over body  . Dizziness and weakness   Immunizations Administered    Name Date Dose VIS Date Route   Pfizer COVID-19 Vaccine 02/09/2020  9:26 AM 0.3 mL 11/19/2019 Intramuscular   Manufacturer: [place]   Lot: [id number]   [place]: [id number]

## 2020-04-25 ENCOUNTER — Encounter: Payer: Self-pay | Admitting: Family Medicine

## 2020-04-25 ENCOUNTER — Other Ambulatory Visit: Payer: Self-pay

## 2020-04-25 ENCOUNTER — Ambulatory Visit (INDEPENDENT_AMBULATORY_CARE_PROVIDER_SITE_OTHER): Payer: Medicare PPO | Admitting: Family Medicine

## 2020-04-25 DIAGNOSIS — M25562 Pain in left knee: Secondary | ICD-10-CM | POA: Diagnosis not present

## 2020-04-25 DIAGNOSIS — G8929 Other chronic pain: Secondary | ICD-10-CM

## 2020-04-25 NOTE — Progress Notes (Signed)
   Office Visit Note   Patient: [name]           Date of Birth: 04-23-1951           MRN: [id number] Visit Date: 04/25/2020 Requested by: Alroy Dust, L.Marlou Sa, [place] [address] [place],  [place] [address] PCP: Alroy Dust, L.Marlou Sa, MD  Subjective: Chief Complaint  Patient presents with  . Left Knee - Pain    Can walk 3 miles without issue. However, she has much pain with going down stairs - flexing the knees hurt. "I feel like my legs are not as strong."    HPI: She is here with left knee pain.  Symptoms started about 4 or 5 months ago, no injury.  Gradual onset of pain especially when going down steps.  She feels like her knee has become weak.  Now her right knee is starting to bother her but not as much.  She did very well with cortisone injection last winter.  She would like one again if indicated.              ROS:   All other systems were reviewed and are negative.  Objective: Vital Signs: There were no vitals taken for this visit.  Physical Exam:  General:  Alert and oriented, in no acute distress. Pulm:  Breathing unlabored. Psy:  Normal mood, congruent affect. Skin: No erythema or rash Left knee: Trace effusion, 2+ patellofemoral crepitus.  Pain with patellar compression.  Full active extension, flexion of 125 degrees.  Imaging: No results found.  Assessment & Plan: 1.  Flareup of left knee DJD -Elected to reinject with cortisone today.  Also will refer her to [facility] rehab for physical therapy/strengthening.     Procedures: Left knee steroid injection: After sterile prep with Betadine, injected 5 cc 1% lidocaine without epinephrine and 40 mg methylprednisolone from lateral midpatellar approach.    PMFS History: Patient Active Problem List   Diagnosis Date Noted  . Menopause present 02/16/2019   Past Medical History:  Diagnosis Date  . Polyp of anal verge     Family History  Problem Relation Age of Onset  . Sjogren's syndrome Mother      History reviewed. No pertinent surgical history. Social History   Occupational History  . Not on file  Tobacco Use  . Smoking status: Former Research scientist (life sciences)  . Smokeless tobacco: Never Used  Substance and Sexual Activity  . Alcohol use: Not on file  . Drug use: No  . Sexual activity: Not on file

## 2020-12-15 DIAGNOSIS — Z1152 Encounter for screening for COVID-19: Secondary | ICD-10-CM | POA: Diagnosis not present

## 2020-12-28 DIAGNOSIS — Z6829 Body mass index (BMI) 29.0-29.9, adult: Secondary | ICD-10-CM | POA: Diagnosis not present

## 2020-12-28 DIAGNOSIS — Z01419 Encounter for gynecological examination (general) (routine) without abnormal findings: Secondary | ICD-10-CM | POA: Diagnosis not present

## 2020-12-28 DIAGNOSIS — Z1231 Encounter for screening mammogram for malignant neoplasm of breast: Secondary | ICD-10-CM | POA: Diagnosis not present

## 2020-12-28 DIAGNOSIS — Z13820 Encounter for screening for osteoporosis: Secondary | ICD-10-CM | POA: Diagnosis not present

## 2020-12-30 ENCOUNTER — Other Ambulatory Visit: Payer: Self-pay | Admitting: Gynecology

## 2020-12-30 DIAGNOSIS — Z13820 Encounter for screening for osteoporosis: Secondary | ICD-10-CM

## 2021-01-09 ENCOUNTER — Ambulatory Visit (INDEPENDENT_AMBULATORY_CARE_PROVIDER_SITE_OTHER): Payer: Medicare HMO | Admitting: Family Medicine

## 2021-01-09 ENCOUNTER — Ambulatory Visit (INDEPENDENT_AMBULATORY_CARE_PROVIDER_SITE_OTHER): Payer: Medicare HMO

## 2021-01-09 ENCOUNTER — Ambulatory Visit: Payer: Self-pay

## 2021-01-09 ENCOUNTER — Telehealth: Payer: Self-pay | Admitting: Family Medicine

## 2021-01-09 ENCOUNTER — Other Ambulatory Visit: Payer: Self-pay

## 2021-01-09 DIAGNOSIS — M25562 Pain in left knee: Secondary | ICD-10-CM | POA: Diagnosis not present

## 2021-01-09 DIAGNOSIS — G8929 Other chronic pain: Secondary | ICD-10-CM

## 2021-01-09 DIAGNOSIS — M25561 Pain in right knee: Secondary | ICD-10-CM | POA: Diagnosis not present

## 2021-01-09 DIAGNOSIS — M1711 Unilateral primary osteoarthritis, right knee: Secondary | ICD-10-CM

## 2021-01-09 DIAGNOSIS — M1712 Unilateral primary osteoarthritis, left knee: Secondary | ICD-10-CM

## 2021-01-09 NOTE — Telephone Encounter (Signed)
Noted  

## 2021-01-09 NOTE — Telephone Encounter (Signed)
Requesting approval for gel injections for bilateral knee OA.

## 2021-01-09 NOTE — Progress Notes (Signed)
   Office Visit Note   Patient: [name]           Date of Birth: 01/08/1951           MRN: [id number] Visit Date: 01/09/2021 Requested by: Alroy Dust, L.Marlou Sa, [place] [address] [place],  [place] [address] PCP: Alroy Dust, L.Marlou Sa, MD  Subjective: Chief Complaint  Patient presents with  . Right Knee - Pain    Left knee is still the worse knee, but the right knee started hurting about 1 year ago. Aching pain in the right thigh x 4 months.   . Left Knee - Pain    The last cortisone injection did not help - not even a week's relief.    HPI: She is here with left greater than right knee pain.  Unfortunately the steroid injection in the left knee did not help, not even for a week.  Both knees are starting to bother her almost equally.  She hurts the most when getting up out of a chair or walking up steps, it feels a little bit better when walking or lying down.  Both knees hurt on the medial and lateral aspect.               ROS:   All other systems were reviewed and are negative.  Objective: Vital Signs: There were no vitals taken for this visit.  Physical Exam:  General:  Alert and oriented, in no acute distress. Pulm:  Breathing unlabored. Psy:  Normal mood, congruent affect.  Knees: She has 2+ patellofemoral crepitus in both knees.  Trace effusion bilaterally.  Both knees are tender on the medial and lateral joint lines, no significant pain with patella compression.  Full active extension, flexion of about 125 degrees.    Imaging: XR Knee 1-2 Views Left  Result Date: 01/09/2021 X-rays of the left knee reveal tricompartmental DJD, most pronounced in the patellofemoral and medial compartments.  There is joint space narrowing and periarticular spurring.  No sign of loose body or stress fracture.  XR Knee 1-2 Views Right  Result Date: 01/09/2021 X-rays of the right knee reveal tricompartmental DJD, most pronounced in the patellofemoral and medial compartments.   There is joint space narrowing and periarticular spurring.  No sign of loose body or stress fracture.   Assessment & Plan: 1.  Chronic bilateral knee pain with patellofemoral and medial compartment DJD, pain has worsened but fortunately the x-rays do not show too much progression since 2020. -Discussed options and elected to try to get approval for gel injections for both knees.  She will follow-up for Injections once approved.     Procedures: No procedures performed        PMFS History: Patient Active Problem List   Diagnosis Date Noted  . Menopause present 02/16/2019   Past Medical History:  Diagnosis Date  . Polyp of anal verge     Family History  Problem Relation Age of Onset  . Sjogren's syndrome Mother     No past surgical history on file. Social History   Occupational History  . Not on file  Tobacco Use  . Smoking status: Former Research scientist (life sciences)  . Smokeless tobacco: Never Used  Vaping Use  . Vaping Use: Never used  Substance and Sexual Activity  . Alcohol use: Not on file  . Drug use: No  . Sexual activity: Not on file

## 2021-01-22 ENCOUNTER — Telehealth: Payer: Self-pay | Admitting: Family Medicine

## 2021-01-22 NOTE — Telephone Encounter (Signed)
Pt would like an update on where she stands on getting approved for her gel injection? She states she's been waiting already for about 2 weeks   [phone]

## 2021-01-23 ENCOUNTER — Telehealth: Payer: Self-pay

## 2021-01-23 ENCOUNTER — Ambulatory Visit
Admission: RE | Admit: 2021-01-23 | Discharge: 2021-01-23 | Disposition: A | Discharge status: Home / Self Care | Payer: Medicare HMO | Source: Ambulatory Visit | Attending: Gynecology | Admitting: Gynecology

## 2021-01-23 ENCOUNTER — Other Ambulatory Visit: Payer: Self-pay

## 2021-01-23 DIAGNOSIS — Z780 Asymptomatic menopausal state: Secondary | ICD-10-CM | POA: Diagnosis not present

## 2021-01-23 DIAGNOSIS — Z13820 Encounter for screening for osteoporosis: Secondary | ICD-10-CM

## 2021-01-23 NOTE — Telephone Encounter (Signed)
Submitted VOB for Gel-One, bilateral knee. Pending BV

## 2021-01-23 NOTE — Telephone Encounter (Signed)
Talked with patient concerning gel injection.  

## 2021-01-29 ENCOUNTER — Telehealth: Payer: Self-pay

## 2021-01-29 NOTE — Telephone Encounter (Signed)
Submitted for SynviscOne, Pending BV, due to Gel-One not being a preferred medication through patient's insurance.

## 2021-02-01 ENCOUNTER — Telehealth: Payer: Self-pay

## 2021-02-01 NOTE — Telephone Encounter (Signed)
Approved for SynviscOne, bilateral knee. [place] Patient will be responsible for 20% OOP. Co-pay of $25.00 [place] Approval# [id number] Valid 01/29/2021- 04/28/2021  Appt.02/08/2021 with Dr. Junius Roads

## 2021-02-08 ENCOUNTER — Encounter: Payer: Self-pay | Admitting: Family Medicine

## 2021-02-08 ENCOUNTER — Other Ambulatory Visit: Payer: Self-pay

## 2021-02-08 ENCOUNTER — Ambulatory Visit: Payer: Medicare HMO | Admitting: Family Medicine

## 2021-02-08 DIAGNOSIS — M1711 Unilateral primary osteoarthritis, right knee: Secondary | ICD-10-CM | POA: Diagnosis not present

## 2021-02-08 DIAGNOSIS — M1712 Unilateral primary osteoarthritis, left knee: Secondary | ICD-10-CM | POA: Diagnosis not present

## 2021-02-08 NOTE — Progress Notes (Signed)
   Office Visit Note   Patient: [name]           Date of Birth: 28-Apr-1951           MRN: [id number] Visit Date: 02/08/2021 Requested by: Alroy Dust, L.Marlou Sa, [place] [address] [place],  [place] [address] PCP: Alroy Dust, L.Marlou Sa, MD  Subjective: Chief Complaint  Patient presents with  . Right Knee - Pain, Follow-up    Planned bilateral Synvisc One injections  . Left Knee - Pain, Follow-up    HPI: She is here for follow-up bilateral knee osteoarthritis, for planned Synvisc 1 injections.               ROS:   All other systems were reviewed and are negative.  Objective: Vital Signs: There were no vitals taken for this visit.  Physical Exam:  General:  Alert and oriented, in no acute distress. Pulm:  Breathing unlabored. Psy:  Normal mood, congruent affect.  Knees: Trace effusion bilaterally, no warmth.  Imaging: No results found.  Assessment & Plan: 1.  Bilateral knee osteoarthritis -Synvisc 1 injections given today.  Physical therapy at [facility] rehab.  Follow-up as needed.     Procedures: Bilateral knee injections: After sterile prep with Betadine, injected 3 cc 0.25% bupivacaine and Synvisc 1 from lateral midpatellar approach, a flash of clear yellow synovial fluid was obtained prior to each injection.       PMFS History: Patient Active Problem List   Diagnosis Date Noted  . Menopause present 02/16/2019   Past Medical History:  Diagnosis Date  . Polyp of anal verge     Family History  Problem Relation Age of Onset  . Sjogren's syndrome Mother     History reviewed. No pertinent surgical history. Social History   Occupational History  . Not on file  Tobacco Use  . Smoking status: Former Research scientist (life sciences)  . Smokeless tobacco: Never Used  Vaping Use  . Vaping Use: Never used  Substance and Sexual Activity  . Alcohol use: Not on file  . Drug use: No  . Sexual activity: Not on file

## 2021-02-22 DIAGNOSIS — G8929 Other chronic pain: Secondary | ICD-10-CM | POA: Diagnosis not present

## 2021-02-22 DIAGNOSIS — M2550 Pain in unspecified joint: Secondary | ICD-10-CM | POA: Diagnosis not present

## 2021-02-22 DIAGNOSIS — E785 Hyperlipidemia, unspecified: Secondary | ICD-10-CM | POA: Diagnosis not present

## 2021-02-22 DIAGNOSIS — Z87891 Personal history of nicotine dependence: Secondary | ICD-10-CM | POA: Diagnosis not present

## 2021-02-22 DIAGNOSIS — R32 Unspecified urinary incontinence: Secondary | ICD-10-CM | POA: Diagnosis not present

## 2021-02-22 DIAGNOSIS — Z809 Family history of malignant neoplasm, unspecified: Secondary | ICD-10-CM | POA: Diagnosis not present

## 2021-02-22 DIAGNOSIS — Z791 Long term (current) use of non-steroidal anti-inflammatories (NSAID): Secondary | ICD-10-CM | POA: Diagnosis not present

## 2021-02-22 DIAGNOSIS — R030 Elevated blood-pressure reading, without diagnosis of hypertension: Secondary | ICD-10-CM | POA: Diagnosis not present

## 2021-06-19 DIAGNOSIS — L821 Other seborrheic keratosis: Secondary | ICD-10-CM | POA: Diagnosis not present

## 2021-06-19 DIAGNOSIS — L111 Transient acantholytic dermatosis [Grover]: Secondary | ICD-10-CM | POA: Diagnosis not present

## 2021-06-19 DIAGNOSIS — D1801 Hemangioma of skin and subcutaneous tissue: Secondary | ICD-10-CM | POA: Diagnosis not present

## 2021-06-19 DIAGNOSIS — Z85828 Personal history of other malignant neoplasm of skin: Secondary | ICD-10-CM | POA: Diagnosis not present

## 2021-06-19 DIAGNOSIS — L570 Actinic keratosis: Secondary | ICD-10-CM | POA: Diagnosis not present

## 2021-06-28 DIAGNOSIS — M1712 Unilateral primary osteoarthritis, left knee: Secondary | ICD-10-CM | POA: Diagnosis not present

## 2021-07-16 DIAGNOSIS — M129 Arthropathy, unspecified: Secondary | ICD-10-CM | POA: Diagnosis not present

## 2021-07-18 DIAGNOSIS — Z20822 Contact with and (suspected) exposure to covid-19: Secondary | ICD-10-CM | POA: Diagnosis not present

## 2021-07-27 DIAGNOSIS — R2689 Other abnormalities of gait and mobility: Secondary | ICD-10-CM | POA: Diagnosis not present

## 2021-07-27 DIAGNOSIS — M1712 Unilateral primary osteoarthritis, left knee: Secondary | ICD-10-CM | POA: Diagnosis not present

## 2021-07-27 DIAGNOSIS — M6281 Muscle weakness (generalized): Secondary | ICD-10-CM | POA: Diagnosis not present

## 2021-07-30 DIAGNOSIS — E782 Mixed hyperlipidemia: Secondary | ICD-10-CM | POA: Diagnosis not present

## 2021-07-30 DIAGNOSIS — I890 Lymphedema, not elsewhere classified: Secondary | ICD-10-CM | POA: Diagnosis not present

## 2021-07-30 DIAGNOSIS — Z0000 Encounter for general adult medical examination without abnormal findings: Secondary | ICD-10-CM | POA: Diagnosis not present

## 2021-08-08 DIAGNOSIS — M1712 Unilateral primary osteoarthritis, left knee: Secondary | ICD-10-CM | POA: Diagnosis not present

## 2021-08-17 DIAGNOSIS — M1712 Unilateral primary osteoarthritis, left knee: Secondary | ICD-10-CM | POA: Diagnosis not present

## 2021-08-17 DIAGNOSIS — M21162 Varus deformity, not elsewhere classified, left knee: Secondary | ICD-10-CM | POA: Diagnosis not present

## 2021-08-17 DIAGNOSIS — G8918 Other acute postprocedural pain: Secondary | ICD-10-CM | POA: Diagnosis not present

## 2021-08-21 DIAGNOSIS — E7800 Pure hypercholesterolemia, unspecified: Secondary | ICD-10-CM | POA: Diagnosis not present

## 2021-08-21 DIAGNOSIS — Z471 Aftercare following joint replacement surgery: Secondary | ICD-10-CM | POA: Diagnosis not present

## 2021-08-21 DIAGNOSIS — Z96652 Presence of left artificial knee joint: Secondary | ICD-10-CM | POA: Diagnosis not present

## 2021-08-23 DIAGNOSIS — E7800 Pure hypercholesterolemia, unspecified: Secondary | ICD-10-CM | POA: Diagnosis not present

## 2021-08-23 DIAGNOSIS — Z471 Aftercare following joint replacement surgery: Secondary | ICD-10-CM | POA: Diagnosis not present

## 2021-08-23 DIAGNOSIS — Z96652 Presence of left artificial knee joint: Secondary | ICD-10-CM | POA: Diagnosis not present

## 2021-08-24 DIAGNOSIS — Z471 Aftercare following joint replacement surgery: Secondary | ICD-10-CM | POA: Diagnosis not present

## 2021-08-24 DIAGNOSIS — Z96652 Presence of left artificial knee joint: Secondary | ICD-10-CM | POA: Diagnosis not present

## 2021-08-24 DIAGNOSIS — E7800 Pure hypercholesterolemia, unspecified: Secondary | ICD-10-CM | POA: Diagnosis not present

## 2021-08-25 DIAGNOSIS — Z96652 Presence of left artificial knee joint: Secondary | ICD-10-CM | POA: Diagnosis not present

## 2021-08-25 DIAGNOSIS — E7800 Pure hypercholesterolemia, unspecified: Secondary | ICD-10-CM | POA: Diagnosis not present

## 2021-08-25 DIAGNOSIS — Z471 Aftercare following joint replacement surgery: Secondary | ICD-10-CM | POA: Diagnosis not present

## 2021-08-26 DIAGNOSIS — E7800 Pure hypercholesterolemia, unspecified: Secondary | ICD-10-CM | POA: Diagnosis not present

## 2021-08-26 DIAGNOSIS — Z96652 Presence of left artificial knee joint: Secondary | ICD-10-CM | POA: Diagnosis not present

## 2021-08-26 DIAGNOSIS — Z471 Aftercare following joint replacement surgery: Secondary | ICD-10-CM | POA: Diagnosis not present

## 2021-08-27 DIAGNOSIS — Z96652 Presence of left artificial knee joint: Secondary | ICD-10-CM | POA: Diagnosis not present

## 2021-08-27 DIAGNOSIS — Z471 Aftercare following joint replacement surgery: Secondary | ICD-10-CM | POA: Diagnosis not present

## 2021-08-27 DIAGNOSIS — E7800 Pure hypercholesterolemia, unspecified: Secondary | ICD-10-CM | POA: Diagnosis not present

## 2021-08-28 DIAGNOSIS — Z9889 Other specified postprocedural states: Secondary | ICD-10-CM | POA: Diagnosis not present

## 2021-08-28 DIAGNOSIS — R269 Unspecified abnormalities of gait and mobility: Secondary | ICD-10-CM | POA: Diagnosis not present

## 2021-08-28 DIAGNOSIS — Z96652 Presence of left artificial knee joint: Secondary | ICD-10-CM | POA: Diagnosis not present

## 2021-08-28 DIAGNOSIS — M6281 Muscle weakness (generalized): Secondary | ICD-10-CM | POA: Diagnosis not present

## 2021-08-28 DIAGNOSIS — Z471 Aftercare following joint replacement surgery: Secondary | ICD-10-CM | POA: Diagnosis not present

## 2021-08-29 DIAGNOSIS — R269 Unspecified abnormalities of gait and mobility: Secondary | ICD-10-CM | POA: Diagnosis not present

## 2021-08-29 DIAGNOSIS — M6281 Muscle weakness (generalized): Secondary | ICD-10-CM | POA: Diagnosis not present

## 2021-08-29 DIAGNOSIS — Z96652 Presence of left artificial knee joint: Secondary | ICD-10-CM | POA: Diagnosis not present

## 2021-08-29 DIAGNOSIS — Z9889 Other specified postprocedural states: Secondary | ICD-10-CM | POA: Diagnosis not present

## 2021-09-03 DIAGNOSIS — Z96652 Presence of left artificial knee joint: Secondary | ICD-10-CM | POA: Diagnosis not present

## 2021-09-03 DIAGNOSIS — Z9889 Other specified postprocedural states: Secondary | ICD-10-CM | POA: Diagnosis not present

## 2021-09-03 DIAGNOSIS — R269 Unspecified abnormalities of gait and mobility: Secondary | ICD-10-CM | POA: Diagnosis not present

## 2021-09-03 DIAGNOSIS — M6281 Muscle weakness (generalized): Secondary | ICD-10-CM | POA: Diagnosis not present

## 2021-09-05 DIAGNOSIS — R269 Unspecified abnormalities of gait and mobility: Secondary | ICD-10-CM | POA: Diagnosis not present

## 2021-09-05 DIAGNOSIS — Z9889 Other specified postprocedural states: Secondary | ICD-10-CM | POA: Diagnosis not present

## 2021-09-05 DIAGNOSIS — Z96652 Presence of left artificial knee joint: Secondary | ICD-10-CM | POA: Diagnosis not present

## 2021-09-05 DIAGNOSIS — M6281 Muscle weakness (generalized): Secondary | ICD-10-CM | POA: Diagnosis not present

## 2021-09-10 DIAGNOSIS — Z96652 Presence of left artificial knee joint: Secondary | ICD-10-CM | POA: Diagnosis not present

## 2021-09-10 DIAGNOSIS — Z9889 Other specified postprocedural states: Secondary | ICD-10-CM | POA: Diagnosis not present

## 2021-09-10 DIAGNOSIS — R269 Unspecified abnormalities of gait and mobility: Secondary | ICD-10-CM | POA: Diagnosis not present

## 2021-09-10 DIAGNOSIS — M6281 Muscle weakness (generalized): Secondary | ICD-10-CM | POA: Diagnosis not present

## 2021-09-12 DIAGNOSIS — Z9889 Other specified postprocedural states: Secondary | ICD-10-CM | POA: Diagnosis not present

## 2021-09-12 DIAGNOSIS — M6281 Muscle weakness (generalized): Secondary | ICD-10-CM | POA: Diagnosis not present

## 2021-09-12 DIAGNOSIS — R269 Unspecified abnormalities of gait and mobility: Secondary | ICD-10-CM | POA: Diagnosis not present

## 2021-09-12 DIAGNOSIS — Z96652 Presence of left artificial knee joint: Secondary | ICD-10-CM | POA: Diagnosis not present

## 2021-09-17 DIAGNOSIS — Z96652 Presence of left artificial knee joint: Secondary | ICD-10-CM | POA: Diagnosis not present

## 2021-09-17 DIAGNOSIS — R269 Unspecified abnormalities of gait and mobility: Secondary | ICD-10-CM | POA: Diagnosis not present

## 2021-09-17 DIAGNOSIS — M6281 Muscle weakness (generalized): Secondary | ICD-10-CM | POA: Diagnosis not present

## 2021-09-17 DIAGNOSIS — Z9889 Other specified postprocedural states: Secondary | ICD-10-CM | POA: Diagnosis not present

## 2021-09-19 DIAGNOSIS — Z96652 Presence of left artificial knee joint: Secondary | ICD-10-CM | POA: Diagnosis not present

## 2021-09-19 DIAGNOSIS — R269 Unspecified abnormalities of gait and mobility: Secondary | ICD-10-CM | POA: Diagnosis not present

## 2021-09-19 DIAGNOSIS — M6281 Muscle weakness (generalized): Secondary | ICD-10-CM | POA: Diagnosis not present

## 2021-09-19 DIAGNOSIS — Z9889 Other specified postprocedural states: Secondary | ICD-10-CM | POA: Diagnosis not present

## 2021-09-25 DIAGNOSIS — Z9889 Other specified postprocedural states: Secondary | ICD-10-CM | POA: Diagnosis not present

## 2021-09-25 DIAGNOSIS — R269 Unspecified abnormalities of gait and mobility: Secondary | ICD-10-CM | POA: Diagnosis not present

## 2021-09-25 DIAGNOSIS — Z96652 Presence of left artificial knee joint: Secondary | ICD-10-CM | POA: Diagnosis not present

## 2021-09-25 DIAGNOSIS — M6281 Muscle weakness (generalized): Secondary | ICD-10-CM | POA: Diagnosis not present

## 2021-09-26 DIAGNOSIS — R269 Unspecified abnormalities of gait and mobility: Secondary | ICD-10-CM | POA: Diagnosis not present

## 2021-09-26 DIAGNOSIS — M6281 Muscle weakness (generalized): Secondary | ICD-10-CM | POA: Diagnosis not present

## 2021-09-26 DIAGNOSIS — Z96652 Presence of left artificial knee joint: Secondary | ICD-10-CM | POA: Diagnosis not present

## 2021-09-26 DIAGNOSIS — Z9889 Other specified postprocedural states: Secondary | ICD-10-CM | POA: Diagnosis not present

## 2021-10-01 DIAGNOSIS — M6281 Muscle weakness (generalized): Secondary | ICD-10-CM | POA: Diagnosis not present

## 2021-10-01 DIAGNOSIS — R269 Unspecified abnormalities of gait and mobility: Secondary | ICD-10-CM | POA: Diagnosis not present

## 2021-10-01 DIAGNOSIS — Z96652 Presence of left artificial knee joint: Secondary | ICD-10-CM | POA: Diagnosis not present

## 2021-10-01 DIAGNOSIS — Z9889 Other specified postprocedural states: Secondary | ICD-10-CM | POA: Diagnosis not present

## 2021-10-03 DIAGNOSIS — Z96652 Presence of left artificial knee joint: Secondary | ICD-10-CM | POA: Diagnosis not present

## 2021-10-03 DIAGNOSIS — Z9889 Other specified postprocedural states: Secondary | ICD-10-CM | POA: Diagnosis not present

## 2021-10-03 DIAGNOSIS — M6281 Muscle weakness (generalized): Secondary | ICD-10-CM | POA: Diagnosis not present

## 2021-10-03 DIAGNOSIS — R269 Unspecified abnormalities of gait and mobility: Secondary | ICD-10-CM | POA: Diagnosis not present

## 2021-10-08 DIAGNOSIS — Z96652 Presence of left artificial knee joint: Secondary | ICD-10-CM | POA: Diagnosis not present

## 2021-10-08 DIAGNOSIS — Z9889 Other specified postprocedural states: Secondary | ICD-10-CM | POA: Diagnosis not present

## 2021-10-08 DIAGNOSIS — R269 Unspecified abnormalities of gait and mobility: Secondary | ICD-10-CM | POA: Diagnosis not present

## 2021-10-08 DIAGNOSIS — M6281 Muscle weakness (generalized): Secondary | ICD-10-CM | POA: Diagnosis not present

## 2021-10-10 DIAGNOSIS — M6281 Muscle weakness (generalized): Secondary | ICD-10-CM | POA: Diagnosis not present

## 2021-10-10 DIAGNOSIS — R269 Unspecified abnormalities of gait and mobility: Secondary | ICD-10-CM | POA: Diagnosis not present

## 2021-10-10 DIAGNOSIS — Z9889 Other specified postprocedural states: Secondary | ICD-10-CM | POA: Diagnosis not present

## 2021-10-10 DIAGNOSIS — Z96652 Presence of left artificial knee joint: Secondary | ICD-10-CM | POA: Diagnosis not present

## 2021-10-18 DIAGNOSIS — M6281 Muscle weakness (generalized): Secondary | ICD-10-CM | POA: Diagnosis not present

## 2021-10-18 DIAGNOSIS — R269 Unspecified abnormalities of gait and mobility: Secondary | ICD-10-CM | POA: Diagnosis not present

## 2021-10-18 DIAGNOSIS — Z96652 Presence of left artificial knee joint: Secondary | ICD-10-CM | POA: Diagnosis not present

## 2021-10-18 DIAGNOSIS — Z9889 Other specified postprocedural states: Secondary | ICD-10-CM | POA: Diagnosis not present

## 2021-10-22 DIAGNOSIS — R269 Unspecified abnormalities of gait and mobility: Secondary | ICD-10-CM | POA: Diagnosis not present

## 2021-10-22 DIAGNOSIS — Z9889 Other specified postprocedural states: Secondary | ICD-10-CM | POA: Diagnosis not present

## 2021-10-22 DIAGNOSIS — M6281 Muscle weakness (generalized): Secondary | ICD-10-CM | POA: Diagnosis not present

## 2021-10-22 DIAGNOSIS — Z96652 Presence of left artificial knee joint: Secondary | ICD-10-CM | POA: Diagnosis not present

## 2021-11-06 DIAGNOSIS — M1611 Unilateral primary osteoarthritis, right hip: Secondary | ICD-10-CM | POA: Diagnosis not present

## 2022-03-05 DIAGNOSIS — Z96652 Presence of left artificial knee joint: Secondary | ICD-10-CM | POA: Diagnosis not present

## 2022-03-05 DIAGNOSIS — M1712 Unilateral primary osteoarthritis, left knee: Secondary | ICD-10-CM | POA: Diagnosis not present

## 2022-06-20 DIAGNOSIS — Z85828 Personal history of other malignant neoplasm of skin: Secondary | ICD-10-CM | POA: Diagnosis not present

## 2022-06-20 DIAGNOSIS — D1801 Hemangioma of skin and subcutaneous tissue: Secondary | ICD-10-CM | POA: Diagnosis not present

## 2022-06-20 DIAGNOSIS — L821 Other seborrheic keratosis: Secondary | ICD-10-CM | POA: Diagnosis not present

## 2022-06-20 DIAGNOSIS — L111 Transient acantholytic dermatosis [Grover]: Secondary | ICD-10-CM | POA: Diagnosis not present

## 2022-07-18 DIAGNOSIS — Z96652 Presence of left artificial knee joint: Secondary | ICD-10-CM | POA: Diagnosis not present

## 2022-07-18 DIAGNOSIS — M1611 Unilateral primary osteoarthritis, right hip: Secondary | ICD-10-CM | POA: Diagnosis not present

## 2022-07-18 DIAGNOSIS — Z09 Encounter for follow-up examination after completed treatment for conditions other than malignant neoplasm: Secondary | ICD-10-CM | POA: Diagnosis not present

## 2022-08-13 DIAGNOSIS — E782 Mixed hyperlipidemia: Secondary | ICD-10-CM | POA: Diagnosis not present

## 2022-08-13 DIAGNOSIS — I890 Lymphedema, not elsewhere classified: Secondary | ICD-10-CM | POA: Diagnosis not present

## 2022-08-13 DIAGNOSIS — Z0000 Encounter for general adult medical examination without abnormal findings: Secondary | ICD-10-CM | POA: Diagnosis not present

## 2022-08-13 DIAGNOSIS — Z23 Encounter for immunization: Secondary | ICD-10-CM | POA: Diagnosis not present

## 2022-08-19 DIAGNOSIS — R32 Unspecified urinary incontinence: Secondary | ICD-10-CM | POA: Diagnosis not present

## 2022-08-19 DIAGNOSIS — R030 Elevated blood-pressure reading, without diagnosis of hypertension: Secondary | ICD-10-CM | POA: Diagnosis not present

## 2022-08-19 DIAGNOSIS — Z87891 Personal history of nicotine dependence: Secondary | ICD-10-CM | POA: Diagnosis not present

## 2022-08-19 DIAGNOSIS — M1990 Unspecified osteoarthritis, unspecified site: Secondary | ICD-10-CM | POA: Diagnosis not present

## 2022-08-19 DIAGNOSIS — E785 Hyperlipidemia, unspecified: Secondary | ICD-10-CM | POA: Diagnosis not present

## 2022-08-19 DIAGNOSIS — Z85828 Personal history of other malignant neoplasm of skin: Secondary | ICD-10-CM | POA: Diagnosis not present

## 2022-08-19 DIAGNOSIS — Z809 Family history of malignant neoplasm, unspecified: Secondary | ICD-10-CM | POA: Diagnosis not present

## 2022-08-19 DIAGNOSIS — Z791 Long term (current) use of non-steroidal anti-inflammatories (NSAID): Secondary | ICD-10-CM | POA: Diagnosis not present

## 2022-10-16 DIAGNOSIS — R945 Abnormal results of liver function studies: Secondary | ICD-10-CM | POA: Diagnosis not present

## 2023-01-06 DIAGNOSIS — R69 Illness, unspecified: Secondary | ICD-10-CM | POA: Diagnosis not present

## 2023-01-20 DIAGNOSIS — H33301 Unspecified retinal break, right eye: Secondary | ICD-10-CM | POA: Diagnosis not present

## 2023-01-20 DIAGNOSIS — H52223 Regular astigmatism, bilateral: Secondary | ICD-10-CM | POA: Diagnosis not present

## 2023-01-20 DIAGNOSIS — H43393 Other vitreous opacities, bilateral: Secondary | ICD-10-CM | POA: Diagnosis not present

## 2023-01-20 DIAGNOSIS — H5211 Myopia, right eye: Secondary | ICD-10-CM | POA: Diagnosis not present

## 2023-01-20 DIAGNOSIS — H5202 Hypermetropia, left eye: Secondary | ICD-10-CM | POA: Diagnosis not present

## 2023-01-20 DIAGNOSIS — H524 Presbyopia: Secondary | ICD-10-CM | POA: Diagnosis not present

## 2023-01-20 DIAGNOSIS — H40013 Open angle with borderline findings, low risk, bilateral: Secondary | ICD-10-CM | POA: Diagnosis not present

## 2023-01-20 DIAGNOSIS — H59811 Chorioretinal scars after surgery for detachment, right eye: Secondary | ICD-10-CM | POA: Diagnosis not present

## 2023-01-20 DIAGNOSIS — H25812 Combined forms of age-related cataract, left eye: Secondary | ICD-10-CM | POA: Diagnosis not present

## 2023-01-20 DIAGNOSIS — Z961 Presence of intraocular lens: Secondary | ICD-10-CM | POA: Diagnosis not present

## 2023-01-20 DIAGNOSIS — Z9841 Cataract extraction status, right eye: Secondary | ICD-10-CM | POA: Diagnosis not present

## 2023-01-20 DIAGNOSIS — H40053 Ocular hypertension, bilateral: Secondary | ICD-10-CM | POA: Diagnosis not present

## 2023-01-22 DIAGNOSIS — R69 Illness, unspecified: Secondary | ICD-10-CM | POA: Diagnosis not present

## 2023-02-05 DIAGNOSIS — R69 Illness, unspecified: Secondary | ICD-10-CM | POA: Diagnosis not present

## 2023-03-05 DIAGNOSIS — R69 Illness, unspecified: Secondary | ICD-10-CM | POA: Diagnosis not present

## 2023-04-07 DIAGNOSIS — R69 Illness, unspecified: Secondary | ICD-10-CM | POA: Diagnosis not present

## 2023-04-23 DIAGNOSIS — D123 Benign neoplasm of transverse colon: Secondary | ICD-10-CM | POA: Diagnosis not present

## 2023-04-23 DIAGNOSIS — D124 Benign neoplasm of descending colon: Secondary | ICD-10-CM | POA: Diagnosis not present

## 2023-04-23 DIAGNOSIS — Z86010 Personal history of colonic polyps: Secondary | ICD-10-CM | POA: Diagnosis not present

## 2023-04-23 DIAGNOSIS — Z09 Encounter for follow-up examination after completed treatment for conditions other than malignant neoplasm: Secondary | ICD-10-CM | POA: Diagnosis not present

## 2023-04-23 DIAGNOSIS — K6289 Other specified diseases of anus and rectum: Secondary | ICD-10-CM | POA: Diagnosis not present

## 2023-04-25 DIAGNOSIS — D123 Benign neoplasm of transverse colon: Secondary | ICD-10-CM | POA: Diagnosis not present

## 2023-04-25 DIAGNOSIS — D124 Benign neoplasm of descending colon: Secondary | ICD-10-CM | POA: Diagnosis not present

## 2023-05-16 DIAGNOSIS — R69 Illness, unspecified: Secondary | ICD-10-CM | POA: Diagnosis not present

## 2023-06-06 DIAGNOSIS — R69 Illness, unspecified: Secondary | ICD-10-CM | POA: Diagnosis not present

## 2023-06-10 DIAGNOSIS — M545 Low back pain, unspecified: Secondary | ICD-10-CM | POA: Diagnosis not present

## 2023-06-29 DIAGNOSIS — R69 Illness, unspecified: Secondary | ICD-10-CM | POA: Diagnosis not present

## 2023-08-05 DIAGNOSIS — Z85828 Personal history of other malignant neoplasm of skin: Secondary | ICD-10-CM | POA: Diagnosis not present

## 2023-08-05 DIAGNOSIS — D1801 Hemangioma of skin and subcutaneous tissue: Secondary | ICD-10-CM | POA: Diagnosis not present

## 2023-08-05 DIAGNOSIS — L812 Freckles: Secondary | ICD-10-CM | POA: Diagnosis not present

## 2023-08-05 DIAGNOSIS — D225 Melanocytic nevi of trunk: Secondary | ICD-10-CM | POA: Diagnosis not present

## 2023-08-05 DIAGNOSIS — D485 Neoplasm of uncertain behavior of skin: Secondary | ICD-10-CM | POA: Diagnosis not present

## 2023-08-05 DIAGNOSIS — L821 Other seborrheic keratosis: Secondary | ICD-10-CM | POA: Diagnosis not present

## 2023-08-18 DIAGNOSIS — L820 Inflamed seborrheic keratosis: Secondary | ICD-10-CM | POA: Diagnosis not present

## 2023-08-18 DIAGNOSIS — D235 Other benign neoplasm of skin of trunk: Secondary | ICD-10-CM | POA: Diagnosis not present

## 2023-08-18 DIAGNOSIS — D485 Neoplasm of uncertain behavior of skin: Secondary | ICD-10-CM | POA: Diagnosis not present

## 2023-08-21 DIAGNOSIS — Z1239 Encounter for other screening for malignant neoplasm of breast: Secondary | ICD-10-CM | POA: Diagnosis not present

## 2023-08-21 DIAGNOSIS — M129 Arthropathy, unspecified: Secondary | ICD-10-CM | POA: Diagnosis not present

## 2023-08-21 DIAGNOSIS — Z0000 Encounter for general adult medical examination without abnormal findings: Secondary | ICD-10-CM | POA: Diagnosis not present

## 2023-08-21 DIAGNOSIS — E782 Mixed hyperlipidemia: Secondary | ICD-10-CM | POA: Diagnosis not present

## 2023-08-21 DIAGNOSIS — I890 Lymphedema, not elsewhere classified: Secondary | ICD-10-CM | POA: Diagnosis not present

## 2023-09-03 DIAGNOSIS — R69 Illness, unspecified: Secondary | ICD-10-CM | POA: Diagnosis not present

## 2023-09-18 DIAGNOSIS — R69 Illness, unspecified: Secondary | ICD-10-CM | POA: Diagnosis not present

## 2023-09-23 DIAGNOSIS — E782 Mixed hyperlipidemia: Secondary | ICD-10-CM | POA: Diagnosis not present

## 2023-11-10 DIAGNOSIS — Z008 Encounter for other general examination: Secondary | ICD-10-CM | POA: Diagnosis not present

## 2023-12-17 DIAGNOSIS — M545 Low back pain, unspecified: Secondary | ICD-10-CM | POA: Diagnosis not present

## 2023-12-17 DIAGNOSIS — M1611 Unilateral primary osteoarthritis, right hip: Secondary | ICD-10-CM | POA: Diagnosis not present

## 2024-01-08 DIAGNOSIS — M25551 Pain in right hip: Secondary | ICD-10-CM | POA: Diagnosis not present

## 2024-01-20 DIAGNOSIS — S61002A Unspecified open wound of left thumb without damage to nail, initial encounter: Secondary | ICD-10-CM | POA: Diagnosis not present

## 2024-01-21 DIAGNOSIS — H5211 Myopia, right eye: Secondary | ICD-10-CM | POA: Diagnosis not present

## 2024-01-23 DIAGNOSIS — S61002D Unspecified open wound of left thumb without damage to nail, subsequent encounter: Secondary | ICD-10-CM | POA: Diagnosis not present

## 2024-01-23 DIAGNOSIS — T148XXD Other injury of unspecified body region, subsequent encounter: Secondary | ICD-10-CM | POA: Diagnosis not present

## 2024-01-28 DIAGNOSIS — M25551 Pain in right hip: Secondary | ICD-10-CM | POA: Diagnosis not present

## 2024-01-28 DIAGNOSIS — M1611 Unilateral primary osteoarthritis, right hip: Secondary | ICD-10-CM | POA: Diagnosis not present

## 2024-02-05 DIAGNOSIS — M1611 Unilateral primary osteoarthritis, right hip: Secondary | ICD-10-CM | POA: Diagnosis not present

## 2024-02-13 DIAGNOSIS — M1611 Unilateral primary osteoarthritis, right hip: Secondary | ICD-10-CM | POA: Diagnosis not present

## 2024-02-13 DIAGNOSIS — Z96642 Presence of left artificial hip joint: Secondary | ICD-10-CM | POA: Diagnosis not present

## 2024-02-24 DIAGNOSIS — M25651 Stiffness of right hip, not elsewhere classified: Secondary | ICD-10-CM | POA: Diagnosis not present

## 2024-02-25 DIAGNOSIS — Z96641 Presence of right artificial hip joint: Secondary | ICD-10-CM | POA: Diagnosis not present

## 2024-02-25 DIAGNOSIS — M25651 Stiffness of right hip, not elsewhere classified: Secondary | ICD-10-CM | POA: Diagnosis not present

## 2024-02-25 DIAGNOSIS — M6281 Muscle weakness (generalized): Secondary | ICD-10-CM | POA: Diagnosis not present

## 2024-03-01 DIAGNOSIS — Z96641 Presence of right artificial hip joint: Secondary | ICD-10-CM | POA: Diagnosis not present

## 2024-03-01 DIAGNOSIS — M6281 Muscle weakness (generalized): Secondary | ICD-10-CM | POA: Diagnosis not present

## 2024-03-01 DIAGNOSIS — M25651 Stiffness of right hip, not elsewhere classified: Secondary | ICD-10-CM | POA: Diagnosis not present

## 2024-03-05 DIAGNOSIS — M25651 Stiffness of right hip, not elsewhere classified: Secondary | ICD-10-CM | POA: Diagnosis not present

## 2024-03-05 DIAGNOSIS — Z96641 Presence of right artificial hip joint: Secondary | ICD-10-CM | POA: Diagnosis not present

## 2024-03-05 DIAGNOSIS — M6281 Muscle weakness (generalized): Secondary | ICD-10-CM | POA: Diagnosis not present

## 2024-03-08 DIAGNOSIS — M6281 Muscle weakness (generalized): Secondary | ICD-10-CM | POA: Diagnosis not present

## 2024-03-08 DIAGNOSIS — Z96641 Presence of right artificial hip joint: Secondary | ICD-10-CM | POA: Diagnosis not present

## 2024-03-08 DIAGNOSIS — M25651 Stiffness of right hip, not elsewhere classified: Secondary | ICD-10-CM | POA: Diagnosis not present

## 2024-03-10 DIAGNOSIS — Z96641 Presence of right artificial hip joint: Secondary | ICD-10-CM | POA: Diagnosis not present

## 2024-03-10 DIAGNOSIS — M6281 Muscle weakness (generalized): Secondary | ICD-10-CM | POA: Diagnosis not present

## 2024-03-10 DIAGNOSIS — M25651 Stiffness of right hip, not elsewhere classified: Secondary | ICD-10-CM | POA: Diagnosis not present

## 2024-03-16 DIAGNOSIS — Z96641 Presence of right artificial hip joint: Secondary | ICD-10-CM | POA: Diagnosis not present

## 2024-03-16 DIAGNOSIS — M6281 Muscle weakness (generalized): Secondary | ICD-10-CM | POA: Diagnosis not present

## 2024-03-16 DIAGNOSIS — M25651 Stiffness of right hip, not elsewhere classified: Secondary | ICD-10-CM | POA: Diagnosis not present

## 2024-03-18 DIAGNOSIS — Z96641 Presence of right artificial hip joint: Secondary | ICD-10-CM | POA: Diagnosis not present

## 2024-03-18 DIAGNOSIS — M6281 Muscle weakness (generalized): Secondary | ICD-10-CM | POA: Diagnosis not present

## 2024-03-18 DIAGNOSIS — M25651 Stiffness of right hip, not elsewhere classified: Secondary | ICD-10-CM | POA: Diagnosis not present

## 2024-03-23 DIAGNOSIS — M25651 Stiffness of right hip, not elsewhere classified: Secondary | ICD-10-CM | POA: Diagnosis not present

## 2024-03-23 DIAGNOSIS — Z96641 Presence of right artificial hip joint: Secondary | ICD-10-CM | POA: Diagnosis not present

## 2024-03-23 DIAGNOSIS — M6281 Muscle weakness (generalized): Secondary | ICD-10-CM | POA: Diagnosis not present

## 2024-03-24 DIAGNOSIS — M6281 Muscle weakness (generalized): Secondary | ICD-10-CM | POA: Diagnosis not present

## 2024-03-24 DIAGNOSIS — Z96641 Presence of right artificial hip joint: Secondary | ICD-10-CM | POA: Diagnosis not present

## 2024-03-24 DIAGNOSIS — M25651 Stiffness of right hip, not elsewhere classified: Secondary | ICD-10-CM | POA: Diagnosis not present

## 2024-03-30 DIAGNOSIS — M25651 Stiffness of right hip, not elsewhere classified: Secondary | ICD-10-CM | POA: Diagnosis not present

## 2024-03-30 DIAGNOSIS — Z96641 Presence of right artificial hip joint: Secondary | ICD-10-CM | POA: Diagnosis not present

## 2024-03-30 DIAGNOSIS — M6281 Muscle weakness (generalized): Secondary | ICD-10-CM | POA: Diagnosis not present

## 2024-04-01 DIAGNOSIS — Z96641 Presence of right artificial hip joint: Secondary | ICD-10-CM | POA: Diagnosis not present

## 2024-04-01 DIAGNOSIS — M6281 Muscle weakness (generalized): Secondary | ICD-10-CM | POA: Diagnosis not present

## 2024-04-01 DIAGNOSIS — M25651 Stiffness of right hip, not elsewhere classified: Secondary | ICD-10-CM | POA: Diagnosis not present

## 2024-04-08 DIAGNOSIS — M25651 Stiffness of right hip, not elsewhere classified: Secondary | ICD-10-CM | POA: Diagnosis not present

## 2024-04-08 DIAGNOSIS — M6281 Muscle weakness (generalized): Secondary | ICD-10-CM | POA: Diagnosis not present

## 2024-04-08 DIAGNOSIS — Z96641 Presence of right artificial hip joint: Secondary | ICD-10-CM | POA: Diagnosis not present

## 2024-08-18 DIAGNOSIS — L821 Other seborrheic keratosis: Secondary | ICD-10-CM | POA: Diagnosis not present

## 2024-08-18 DIAGNOSIS — Z85828 Personal history of other malignant neoplasm of skin: Secondary | ICD-10-CM | POA: Diagnosis not present

## 2024-08-18 DIAGNOSIS — L812 Freckles: Secondary | ICD-10-CM | POA: Diagnosis not present

## 2024-08-18 DIAGNOSIS — L820 Inflamed seborrheic keratosis: Secondary | ICD-10-CM | POA: Diagnosis not present

## 2024-10-06 DIAGNOSIS — E782 Mixed hyperlipidemia: Secondary | ICD-10-CM | POA: Diagnosis not present

## 2024-10-08 DIAGNOSIS — Z0000 Encounter for general adult medical examination without abnormal findings: Secondary | ICD-10-CM | POA: Diagnosis not present

## 2024-10-08 DIAGNOSIS — I890 Lymphedema, not elsewhere classified: Secondary | ICD-10-CM | POA: Diagnosis not present

## 2024-10-08 DIAGNOSIS — R32 Unspecified urinary incontinence: Secondary | ICD-10-CM | POA: Diagnosis not present

## 2024-10-08 DIAGNOSIS — R7401 Elevation of levels of liver transaminase levels: Secondary | ICD-10-CM | POA: Diagnosis not present

## 2024-10-08 DIAGNOSIS — E782 Mixed hyperlipidemia: Secondary | ICD-10-CM | POA: Diagnosis not present

## 2024-10-12 DIAGNOSIS — Z1231 Encounter for screening mammogram for malignant neoplasm of breast: Secondary | ICD-10-CM | POA: Diagnosis not present

## 2024-10-12 DIAGNOSIS — Z780 Asymptomatic menopausal state: Secondary | ICD-10-CM | POA: Diagnosis not present

## 2024-10-12 DIAGNOSIS — Z01419 Encounter for gynecological examination (general) (routine) without abnormal findings: Secondary | ICD-10-CM | POA: Diagnosis not present
